# Patient Record
Sex: Male | Born: 1975 | Race: White | Hispanic: No | Marital: Married | Smoking: Never smoker
Health system: Southern US, Community
[De-identification: ages and names within clinical notes are randomized; demographics above are authoritative.]

## PROBLEM LIST (undated history)

## (undated) HISTORY — PX: SHOULDER ARTHROSCOPY: SHX128

## (undated) HISTORY — PX: KNEE SURGERY: SHX244

---

## 2002-04-16 ENCOUNTER — Encounter: Admission: RE | Admit: 2002-04-16 | Discharge: 2002-04-16 | Payer: Self-pay | Admitting: Family Medicine

## 2003-01-31 ENCOUNTER — Emergency Department (HOSPITAL_COMMUNITY): Admission: EM | Admit: 2003-01-31 | Discharge: 2003-02-01 | Payer: Self-pay | Admitting: Emergency Medicine

## 2003-02-01 ENCOUNTER — Inpatient Hospital Stay (HOSPITAL_COMMUNITY): Admission: EM | Admit: 2003-02-01 | Discharge: 2003-02-03 | Payer: Self-pay

## 2003-02-01 ENCOUNTER — Encounter: Payer: Self-pay | Admitting: Emergency Medicine

## 2010-09-16 ENCOUNTER — Ambulatory Visit (HOSPITAL_BASED_OUTPATIENT_CLINIC_OR_DEPARTMENT_OTHER)
Admission: RE | Admit: 2010-09-16 | Discharge: 2010-09-16 | Disposition: A | Discharge status: Home / Self Care | Payer: BC Managed Care – PPO | Source: Ambulatory Visit | Attending: Orthopedic Surgery | Admitting: Orthopedic Surgery

## 2010-09-16 ENCOUNTER — Ambulatory Visit (HOSPITAL_COMMUNITY): Payer: BC Managed Care – PPO | Attending: Orthopedic Surgery

## 2010-09-16 DIAGNOSIS — Z01812 Encounter for preprocedural laboratory examination: Secondary | ICD-10-CM | POA: Insufficient documentation

## 2010-09-16 DIAGNOSIS — S42009A Fracture of unspecified part of unspecified clavicle, initial encounter for closed fracture: Secondary | ICD-10-CM | POA: Insufficient documentation

## 2010-09-16 DIAGNOSIS — S4380XA Sprain of other specified parts of unspecified shoulder girdle, initial encounter: Secondary | ICD-10-CM | POA: Insufficient documentation

## 2010-09-16 DIAGNOSIS — S42033A Displaced fracture of lateral end of unspecified clavicle, initial encounter for closed fracture: Secondary | ICD-10-CM | POA: Insufficient documentation

## 2010-09-16 DIAGNOSIS — Y998 Other external cause status: Secondary | ICD-10-CM | POA: Insufficient documentation

## 2010-09-16 LAB — POCT HEMOGLOBIN-HEMACUE: Hemoglobin: 16.3 g/dL (ref 13.0–17.0)
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
## 2011-12-07 IMAGING — RF DG CLAVICLE*R*
1 series · 2 of 2 positions shown · non-contrast
Comparison: None.

CLINICAL DATA: Right clavicle fixation

RIGHT CLAVICLE - 2+ VIEWS

[Series 1: run · 2 of 2 slices shown]
[im 1/2]
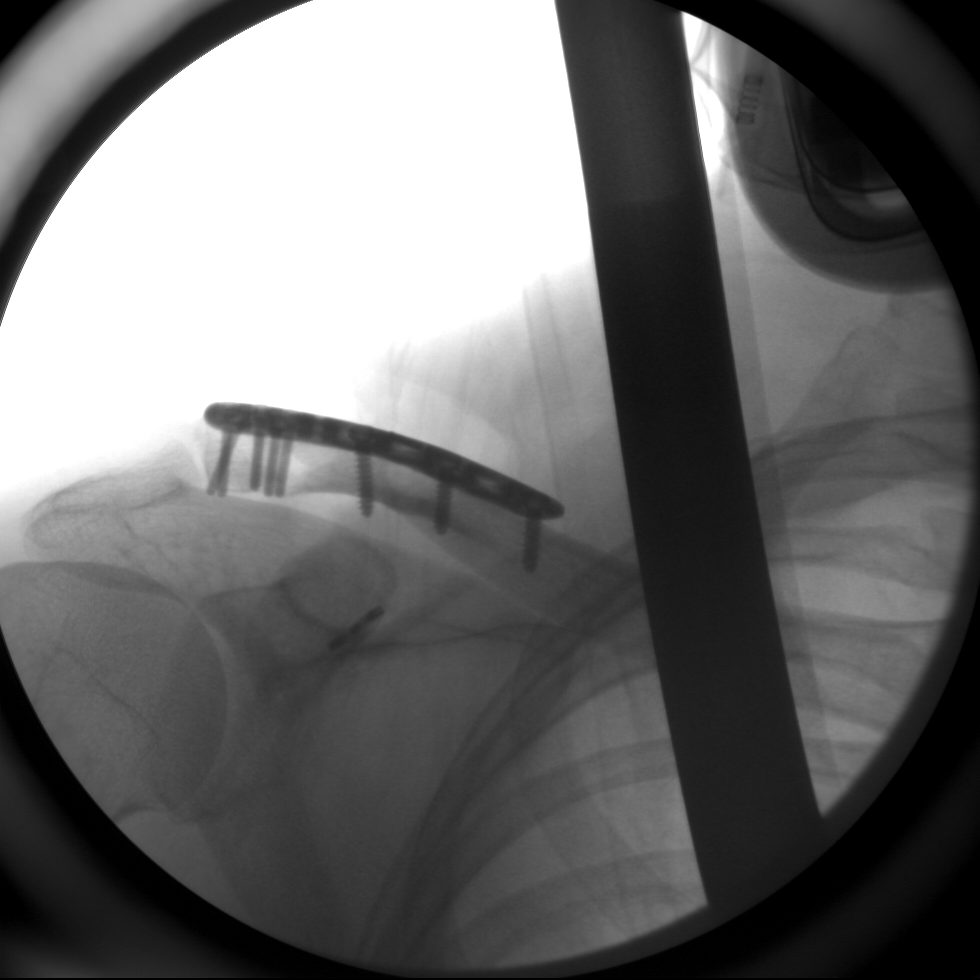
[im 2/2]
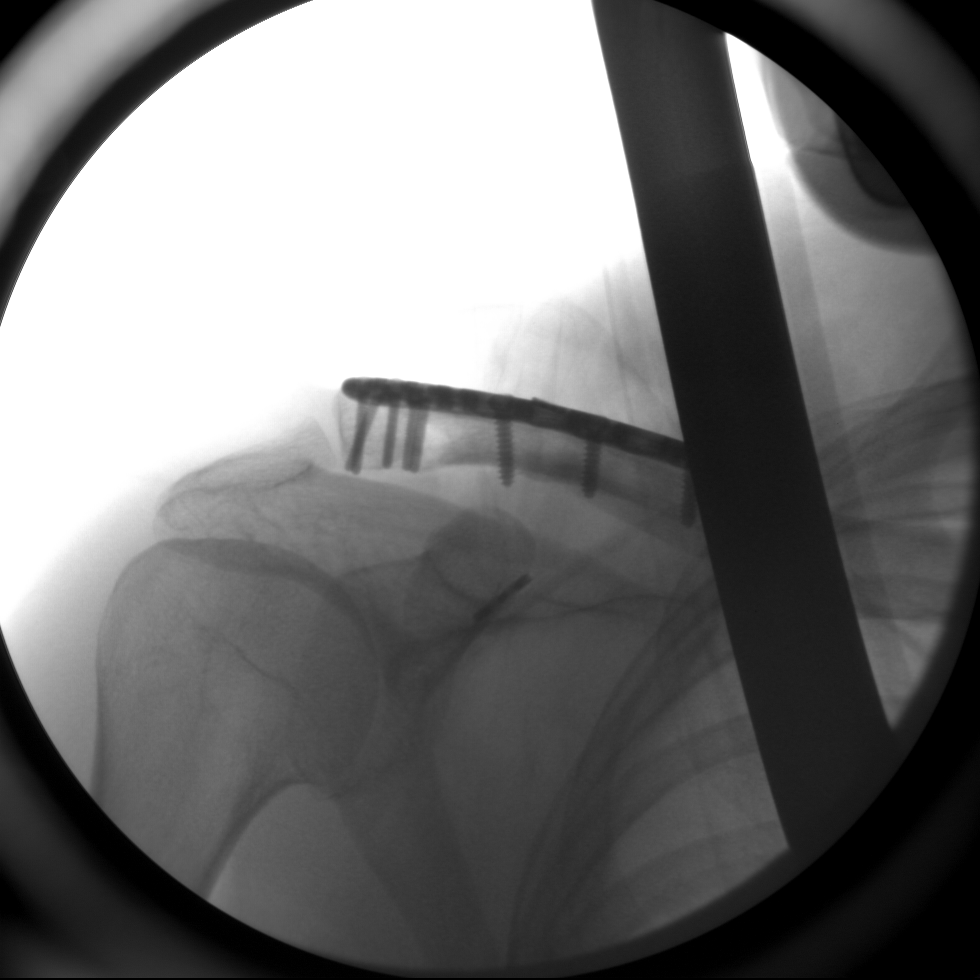

[2 of 2 positions shown; findings below may reference images not displayed]

FINDINGS: Dynamic fixation plate spans a distal right clavicle
fracture.  There are eight cortical screws.  Normal alignment
IMPRESSION: No complication following open reduction internal fixation of right
clavicle fracture.

## 2013-06-11 ENCOUNTER — Emergency Department (HOSPITAL_COMMUNITY)
Admission: EM | Admit: 2013-06-11 | Discharge: 2013-06-11 | Disposition: A | Discharge status: Home / Self Care | Payer: 59 | Attending: Emergency Medicine | Admitting: Emergency Medicine

## 2013-06-11 ENCOUNTER — Encounter (HOSPITAL_COMMUNITY): Payer: Self-pay | Admitting: Emergency Medicine

## 2013-06-11 DIAGNOSIS — I498 Other specified cardiac arrhythmias: Secondary | ICD-10-CM | POA: Insufficient documentation

## 2013-06-11 DIAGNOSIS — R55 Syncope and collapse: Secondary | ICD-10-CM

## 2013-06-11 DIAGNOSIS — R110 Nausea: Secondary | ICD-10-CM | POA: Insufficient documentation

## 2013-06-11 DIAGNOSIS — R42 Dizziness and giddiness: Secondary | ICD-10-CM | POA: Insufficient documentation

## 2013-06-11 LAB — CBC WITH DIFFERENTIAL/PLATELET
BASOS PCT: 0 % (ref 0–1)
Basophils Absolute: 0 10*3/uL (ref 0.0–0.1)
EOS ABS: 0.1 10*3/uL (ref 0.0–0.7)
EOS PCT: 1 % (ref 0–5)
HCT: 41 % (ref 39.0–52.0)
Hemoglobin: 14.7 g/dL (ref 13.0–17.0)
LYMPHS ABS: 1.2 10*3/uL (ref 0.7–4.0)
Lymphocytes Relative: 13 % (ref 12–46)
MCH: 31 pg (ref 26.0–34.0)
MCHC: 35.9 g/dL (ref 30.0–36.0)
MCV: 86.5 fL (ref 78.0–100.0)
MONOS PCT: 6 % (ref 3–12)
Monocytes Absolute: 0.5 10*3/uL (ref 0.1–1.0)
NEUTROS PCT: 80 % — AB (ref 43–77)
Neutro Abs: 7.4 10*3/uL (ref 1.7–7.7)
PLATELETS: 163 10*3/uL (ref 150–400)
RBC: 4.74 MIL/uL (ref 4.22–5.81)
RDW: 11.9 % (ref 11.5–15.5)
WBC: 9.3 10*3/uL (ref 4.0–10.5)

## 2013-06-11 LAB — POCT I-STAT, CHEM 8
BUN: 19 mg/dL (ref 6–23)
CALCIUM ION: 1.23 mmol/L (ref 1.12–1.23)
CREATININE: 1 mg/dL (ref 0.50–1.35)
Chloride: 104 mEq/L (ref 96–112)
Glucose, Bld: 88 mg/dL (ref 70–99)
HCT: 43 % (ref 39.0–52.0)
HEMOGLOBIN: 14.6 g/dL (ref 13.0–17.0)
Potassium: 4.4 mEq/L (ref 3.7–5.3)
SODIUM: 142 meq/L (ref 137–147)
TCO2: 26 mmol/L (ref 0–100)

## 2013-06-11 LAB — POCT I-STAT TROPONIN I: Troponin i, poc: 0.01 ng/mL (ref 0.00–0.08)

## 2013-06-11 LAB — GLUCOSE, CAPILLARY: GLUCOSE-CAPILLARY: 74 mg/dL (ref 70–99)

## 2013-06-11 MED ORDER — SODIUM CHLORIDE 0.9 % IV SOLN
1000.0000 mL | INTRAVENOUS | Status: DC
Start: 2013-06-11 — End: 2013-06-11
  Administered 2013-06-11: 1000 mL via INTRAVENOUS

## 2013-06-11 MED ORDER — SODIUM CHLORIDE 0.9 % IV SOLN
1000.0000 mL | Freq: Once | INTRAVENOUS | Status: AC
  Administered 2013-06-11: 1000 mL via INTRAVENOUS

## 2013-06-11 NOTE — ED Notes (Addendum)
Patient arrived via EMS from work where the patient had a syncopal episode while sitting in a chair for apprx 5 mins. Patient states he didn't feel right and was nauseated prior to episode. Patient slumped over in chair unresponsive with eyes open. Denies any chest pain, shortness of breath. HR 30-40's, BP sys 80's. Patient just had a physical yesterday at his MD's office. He also received a tetanus shot there.

## 2013-06-11 NOTE — Discharge Instructions (Signed)
Syncope  Syncope is a fainting spell. This means the person loses consciousness and drops to the ground. The person is generally unconscious for less than 5 minutes. The person may have some muscle twitches for up to 15 seconds before waking up and returning to normal. Syncope occurs more often in elderly people, but it can happen to anyone. While most causes of syncope are not dangerous, syncope can be a sign of a serious medical problem. It is important to seek medical care.   CAUSES   Syncope is caused by a sudden decrease in blood flow to the brain. The specific cause is often not determined. Factors that can trigger syncope include:   Taking medicines that lower blood pressure.   Sudden changes in posture, such as standing up suddenly.   Taking more medicine than prescribed.   Standing in one place for too long.   Seizure disorders.   Dehydration and excessive exposure to heat.   Low blood sugar (hypoglycemia).   Straining to have a bowel movement.   Heart disease, irregular heartbeat, or other circulatory problems.   Fear, emotional distress, seeing blood, or severe pain.  SYMPTOMS   Right before fainting, you may:   Feel dizzy or lightheaded.   Feel nauseous.   See all white or all black in your field of vision.   Have cold, clammy skin.  DIAGNOSIS   Your caregiver will ask about your symptoms, perform a physical exam, and perform electrocardiography (ECG) to record the electrical activity of your heart. Your caregiver may also perform other heart or blood tests to determine the cause of your syncope.  TREATMENT   In most cases, no treatment is needed. Depending on the cause of your syncope, your caregiver may recommend changing or stopping some of your medicines.  HOME CARE INSTRUCTIONS   Have someone stay with you until you feel stable.   Do not drive, operate machinery, or play sports until your caregiver says it is okay.   Keep all follow-up appointments as directed by your  caregiver.   Lie down right away if you start feeling like you might faint. Breathe deeply and steadily. Wait until all the symptoms have passed.   Drink enough fluids to keep your urine clear or pale yellow.   If you are taking blood pressure or heart medicine, get up slowly, taking several minutes to sit and then stand. This can reduce dizziness.  SEEK IMMEDIATE MEDICAL CARE IF:    You have a severe headache.   You have unusual pain in the chest, abdomen, or back.   You are bleeding from the mouth or rectum, or you have black or tarry stool.   You have an irregular or very fast heartbeat.   You have pain with breathing.   You have repeated fainting or seizure-like jerking during an episode.   You faint when sitting or lying down.   You have confusion.   You have difficulty walking.   You have severe weakness.   You have vision problems.  If you fainted, call your local emergency services (911 in [place] Do not drive yourself to the hospital.   MAKE SURE YOU:   Understand these instructions.   Will watch your condition.   Will get help right away if you are not doing well or get worse.  Document Released: 04/10/2005 Document Revised: 10/10/2011 Document Reviewed: 06/09/2011  Exit[employer] Patient Information 2014 [employer].

## 2013-06-11 NOTE — ED Notes (Signed)
Patient given Zofran 4mg  IV by EMS prior to arrival.

## 2013-06-11 NOTE — ED Provider Notes (Signed)
CSN: [id number]     Arrival date & time 06/11/13  1218 History  First MD Initiated Contact with Patient 06/11/13 1242     Chief Complaint  Patient presents with  . Loss of Consciousness    HPI Comments: Patient states he was at work sitting in a chair. He suddenly started to feel nauseated and lightheaded. He was not having any trouble with any chest pain or shortness of breath. The patient slumped over in the chair. Coworkers called 911. His eyes were open but he was not responsive. He recently had a physical done at his doctor's office just the other day. Had a tetanus shot otherwise no other treatments. Patient states he does exercise regularly and is a runner. Normally his heart rate is slow.  Patient is a 38 y.o. male presenting with syncope. The history is provided by the patient.  Loss of Consciousness Episode history:  Single Most recent episode:  Today Chronicity:  New Context: inactivity   Context: not blood draw, not bowel movement, not dehydration and not medication change     History reviewed. No pertinent past medical history. Past Surgical History  Procedure Laterality Date  . Shoulder arthroscopy    . Knee surgery     History reviewed. No pertinent family history. History  Substance Use Topics  . Smoking status: Never Smoker   . Smokeless tobacco: Not on file  . Alcohol Use: Not on file    Review of Systems  Cardiovascular: Positive for syncope.  All other systems reviewed and are negative.      Allergies  Shellfish allergy  Home Medications  No current outpatient prescriptions on file. BP 108/64  Pulse 51  Temp(Src) 97.7 F (36.5 C)  Resp 29  SpO2 100% Physical Exam  Nursing note and vitals reviewed. Constitutional: He appears well-developed and well-nourished. No distress.  HENT:  Head: Normocephalic and atraumatic.  Right Ear: External ear normal.  Left Ear: External ear normal.  Eyes: Conjunctivae are normal. Right eye exhibits no  discharge. Left eye exhibits no discharge. No scleral icterus.  Neck: Neck supple. No tracheal deviation present.  Cardiovascular: Regular rhythm and intact distal pulses.  Bradycardia present.   Pulmonary/Chest: Effort normal and breath sounds normal. No stridor. No respiratory distress. He has no wheezes. He has no rales.  Abdominal: Soft. Bowel sounds are normal. He exhibits no distension. There is no tenderness. There is no rebound and no guarding.  Musculoskeletal: He exhibits no edema and no tenderness.  Neurological: He is alert. He has normal strength. No cranial nerve deficit (no facial droop, extraocular movements intact, no slurred speech) or sensory deficit. He exhibits normal muscle tone. He displays no seizure activity. Coordination normal.  Skin: Skin is warm and dry. No rash noted.  Psychiatric: He has a normal mood and affect.    ED Course  Procedures (including critical care time) Labs Review Labs Reviewed  CBC WITH DIFFERENTIAL - Abnormal; Notable for the following:    Neutrophils Relative % 80 (*)    All other components within normal limits  GLUCOSE, CAPILLARY  POCT I-STAT, CHEM 8  POCT I-STAT TROPONIN I  POCT CBG (FASTING - GLUCOSE)-MANUAL ENTRY   Imaging Review No results found.  EKG Interpretation    Date/Time:  Wednesday June 11 2013 12:31:10 EST Ventricular Rate:  40 PR Interval:  156 QRS Duration: 90 QT Interval:  540 QTC Calculation: 440 R Axis:   101 Text Interpretation:  Sinus bradycardia Right axis deviation Abnrm T, consider  ischemia, anterolateral lds Borderline ST elevation, anterior leads No old tracing to compare Confirmed by Ahmari Garton  MD-J, Arland Usery ([id number]) on 06/11/2013 3:01:43 PM          1618  Stable in the ED.  Did have an episode of feeling faint while he was having blood drawn.  MDM   Final diagnoses:  Syncope    Pt's EKG is abnormal but I doubt related to cardiac ischemia.  He exercises and runs regularly.  Possible vasovagal  episode. Stable for outpatient follow up but recommended further testing such as holter monitor, echocardiogram.    Celene Kras, MD 06/11/13 [phone]

## 2018-01-21 DIAGNOSIS — Z23 Encounter for immunization: Secondary | ICD-10-CM | POA: Diagnosis not present

## 2018-07-02 DIAGNOSIS — H6121 Impacted cerumen, right ear: Secondary | ICD-10-CM | POA: Diagnosis not present

## 2018-07-02 DIAGNOSIS — E7800 Pure hypercholesterolemia, unspecified: Secondary | ICD-10-CM | POA: Diagnosis not present

## 2018-07-02 DIAGNOSIS — R109 Unspecified abdominal pain: Secondary | ICD-10-CM | POA: Diagnosis not present

## 2018-07-02 DIAGNOSIS — M79676 Pain in unspecified toe(s): Secondary | ICD-10-CM | POA: Diagnosis not present

## 2018-07-09 ENCOUNTER — Other Ambulatory Visit: Payer: Self-pay

## 2018-07-09 ENCOUNTER — Ambulatory Visit (INDEPENDENT_AMBULATORY_CARE_PROVIDER_SITE_OTHER): Payer: BLUE CROSS/BLUE SHIELD | Admitting: Podiatry

## 2018-07-09 VITALS — BP 116/71 | HR 48

## 2018-07-09 DIAGNOSIS — Z79899 Other long term (current) drug therapy: Secondary | ICD-10-CM | POA: Diagnosis not present

## 2018-07-09 DIAGNOSIS — B351 Tinea unguium: Secondary | ICD-10-CM | POA: Diagnosis not present

## 2018-07-09 DIAGNOSIS — B353 Tinea pedis: Secondary | ICD-10-CM

## 2018-07-09 NOTE — Patient Instructions (Signed)

## 2018-07-10 LAB — CBC WITH DIFFERENTIAL/PLATELET
Absolute Monocytes: 353 cells/uL (ref 200–950)
BASOS ABS: 49 {cells}/uL (ref 0–200)
Basophils Relative: 1.3 %
EOS ABS: 190 {cells}/uL (ref 15–500)
Eosinophils Relative: 5 %
HEMATOCRIT: 42.9 % (ref 38.5–50.0)
Hemoglobin: 15.2 g/dL (ref 13.2–17.1)
LYMPHS ABS: 1733 {cells}/uL (ref 850–3900)
MCH: 31.2 pg (ref 27.0–33.0)
MCHC: 35.4 g/dL (ref 32.0–36.0)
MCV: 88.1 fL (ref 80.0–100.0)
MONOS PCT: 9.3 %
MPV: 12.6 fL — ABNORMAL HIGH (ref 7.5–12.5)
NEUTROS ABS: 1474 {cells}/uL — AB (ref 1500–7800)
Neutrophils Relative %: 38.8 %
Platelets: 151 10*3/uL (ref 140–400)
RBC: 4.87 10*6/uL (ref 4.20–5.80)
RDW: 12.2 % (ref 11.0–15.0)
Total Lymphocyte: 45.6 %
WBC: 3.8 10*3/uL (ref 3.8–10.8)

## 2018-07-10 LAB — HEPATIC FUNCTION PANEL
AG Ratio: 2.3 (calc) (ref 1.0–2.5)
ALT: 54 U/L — ABNORMAL HIGH (ref 9–46)
AST: 36 U/L (ref 10–40)
Albumin: 4.5 g/dL (ref 3.6–5.1)
Alkaline phosphatase (APISO): 59 U/L (ref 36–130)
BILIRUBIN DIRECT: 0.1 mg/dL (ref 0.0–0.2)
Globulin: 2 g/dL (calc) (ref 1.9–3.7)
Indirect Bilirubin: 0.4 mg/dL (calc) (ref 0.2–1.2)
Total Bilirubin: 0.5 mg/dL (ref 0.2–1.2)
Total Protein: 6.5 g/dL (ref 6.1–8.1)

## 2018-07-12 ENCOUNTER — Telehealth: Payer: Self-pay | Admitting: *Deleted

## 2018-07-12 NOTE — Telephone Encounter (Signed)
Patient called back about message left for lab results.

## 2018-07-12 NOTE — Telephone Encounter (Signed)
-----   Message from Vivi Barrack, DPM sent at 07/11/2018 10:01 AM EDT ----- Val- please let him know that his liver enzyme is mildly increase and the neutrophils are mildly decreased. Although very mild for both I would not recommend doing oral Lamisil as lamisil can worsen both of these issues. I would do topical antifungal. We could do a ketoconazole cream for athletes foot then a topical, such as Penlac or Jublia for the nails. He was interested in having the 5th toenails removed. Chip Boer was going to get the cost and let him know. Thanks.

## 2018-07-15 NOTE — Progress Notes (Signed)
Subjective:   Patient ID: Tony Merritt, male   DOB: 43 y.o.   MRN: [id number]   HPI 43 year old male presents the office today for concerns of toenail thickened discoloration.  States the fifth toe is almost thickened discolored and cause pain with pressure in shoes.  He states they are not growing as well.  Also he has noticed the left second toenail is growing in angle.  All his nails are thick and yellow.  This is been ongoing for at least 1 year.  No recent treatment.  No other concerns.   Review of Systems  All other systems reviewed and are negative.  No past medical history on file.  Past Surgical History:  Procedure Laterality Date  . KNEE SURGERY    . SHOULDER ARTHROSCOPY      No current outpatient medications on file.  Allergies  Allergen Reactions  . Shellfish Allergy Anaphylaxis         Objective:  Physical Exam  General: AAO x3, NAD  Dermatological: In general his nails have yellow discoloration are hypertrophic and dystrophic.  He has noted bilateral fifth digit toenails are dystrophic as well.  There is tinea pedis present interdigitally.  There is no open sores there is no skin fissures or drainage.   Vascular: Dorsalis Pedis artery and Posterior Tibial artery pedal pulses are 2/4 bilateral with immedate capillary fill time. There is no pain with calf compression, swelling, warmth, erythema.   Neruologic: Grossly intact via light touch bilateral. Protective threshold with Semmes Wienstein monofilament intact to all pedal sites bilateral.   Musculoskeletal: No gross boney pedal deformities bilateral. No pain, crepitus, or limitation noted with foot and ankle range of motion bilateral. Muscular strength 5/5 in all groups tested bilateral.  Gait: Unassisted, Nonantalgic.     Assessment:   Onychomcyosis; onychodystrophy; tinea pedis     Plan:  -Treatment options discussed including all alternatives, risks, and complications -Etiology of symptoms  were discussed -We discussed multiple treatment options for his nails.  There is a component of fungus but this also damage to the nails.  We discussed the fifth toenail removal but he was to check the cost him prior to doing this.  In general discussed treatment options for nail fungus as well as athlete's foot.  We will check a CBC and LFT.  If this comes back normal will start Lamisil.  Discussed side effects, success rates.  Vivi Barrack DPM

## 2018-07-29 ENCOUNTER — Telehealth: Payer: Self-pay | Admitting: Podiatry

## 2018-07-29 NOTE — Telephone Encounter (Signed)
I reviewed lab review communications and I had left a message for pt to call for results and he had called and the message had been taken but not sent to me. I reviewed Dr. Gabriel Rung review of results 07/11/2018 10:01am and told pt I would ask Dr. Ardelle Anton which topical he would orders and call again and cancel the 08/13/2018 appt.

## 2018-07-29 NOTE — Telephone Encounter (Signed)
Pt is scheduled for a Lamisil appt. And said he never heard anything about starting the medication after Lab work. Please call pt ASAP and then let me know if I need to cancel appt. Thanks

## 2018-07-30 MED ORDER — EFINACONAZOLE 10 % EX SOLN: CUTANEOUS | 11 refills | Status: DC

## 2018-07-30 NOTE — Telephone Encounter (Signed)
Left message informing pt Dr. Ardelle Anton would like pt to begin Jublia, and it would be ordered from [facility] [phone] and they would call with coverage and delivery information, and Chip Boer - Accounts Receivable stated the toenail removal is $575.00/toenail and to call Appointment line [phone].

## 2018-07-30 NOTE — Addendum Note (Signed)
Addended by: Alphia Kava D on: 07/30/2018 11:19 AM   Modules accepted: Orders

## 2018-07-30 NOTE — Telephone Encounter (Signed)
From the previous message-  Val- please let him know that his liver enzyme is mildly increase and the neutrophils are mildly decreased. Although very mild for both I would not recommend doing oral Lamisil as lamisil can worsen both of these issues. I would do topical antifungal. We could do a ketoconazole cream for athletes foot then a topical, such as Penlac or Jublia for the nails. He was interested in having the 5th toenails removed. Chip Boer was going to get the cost and let him know. Thanks.  I would prefer the jublia but if not covered then would do penlac.   Thank you.

## 2018-08-13 ENCOUNTER — Ambulatory Visit: Payer: Self-pay | Admitting: Podiatry

## 2019-02-04 DIAGNOSIS — L237 Allergic contact dermatitis due to plants, except food: Secondary | ICD-10-CM | POA: Diagnosis not present

## 2020-08-11 ENCOUNTER — Encounter (HOSPITAL_BASED_OUTPATIENT_CLINIC_OR_DEPARTMENT_OTHER): Payer: Self-pay | Admitting: Family Medicine

## 2020-08-11 ENCOUNTER — Other Ambulatory Visit: Payer: Self-pay

## 2020-08-11 ENCOUNTER — Ambulatory Visit (HOSPITAL_BASED_OUTPATIENT_CLINIC_OR_DEPARTMENT_OTHER): Payer: 59 | Admitting: Family Medicine

## 2020-08-11 ENCOUNTER — Other Ambulatory Visit (HOSPITAL_BASED_OUTPATIENT_CLINIC_OR_DEPARTMENT_OTHER)
Admission: RE | Admit: 2020-08-11 | Discharge: 2020-08-11 | Disposition: A | Discharge status: Home / Self Care | Payer: 59 | Source: Ambulatory Visit | Attending: Family Medicine | Admitting: Family Medicine

## 2020-08-11 VITALS — BP 118/70 | HR 54 | Ht 68.0 in | Wt 145.2 lb

## 2020-08-11 DIAGNOSIS — M791 Myalgia, unspecified site: Secondary | ICD-10-CM | POA: Insufficient documentation

## 2020-08-11 DIAGNOSIS — R5383 Other fatigue: Secondary | ICD-10-CM | POA: Diagnosis present

## 2020-08-11 DIAGNOSIS — Z7689 Persons encountering health services in other specified circumstances: Secondary | ICD-10-CM

## 2020-08-11 LAB — FERRITIN: Ferritin: 165 ng/mL (ref 24–336)

## 2020-08-11 LAB — CBC WITH DIFFERENTIAL/PLATELET
Abs Immature Granulocytes: 0.01 10*3/uL (ref 0.00–0.07)
Basophils Absolute: 0.1 10*3/uL (ref 0.0–0.1)
Basophils Relative: 1 %
Eosinophils Absolute: 0.1 10*3/uL (ref 0.0–0.5)
Eosinophils Relative: 3 %
HCT: 44.7 % (ref 39.0–52.0)
Hemoglobin: 15.7 g/dL (ref 13.0–17.0)
Immature Granulocytes: 0 %
Lymphocytes Relative: 34 %
Lymphs Abs: 1.5 10*3/uL (ref 0.7–4.0)
MCH: 30.8 pg (ref 26.0–34.0)
MCHC: 35.1 g/dL (ref 30.0–36.0)
MCV: 87.8 fL (ref 80.0–100.0)
Monocytes Absolute: 0.5 10*3/uL (ref 0.1–1.0)
Monocytes Relative: 12 %
Neutro Abs: 2.1 10*3/uL (ref 1.7–7.7)
Neutrophils Relative %: 50 %
Platelets: 169 10*3/uL (ref 150–400)
RBC: 5.09 MIL/uL (ref 4.22–5.81)
RDW: 11.6 % (ref 11.5–15.5)
WBC: 4.2 10*3/uL (ref 4.0–10.5)
nRBC: 0 % (ref 0.0–0.2)

## 2020-08-11 LAB — LIPID PANEL
Cholesterol: 266 mg/dL — ABNORMAL HIGH (ref 0–200)
HDL: 72 mg/dL (ref >40)
LDL Cholesterol: 168 mg/dL — ABNORMAL HIGH (ref 0–99)
Total CHOL/HDL Ratio: 3.7 RATIO
Triglycerides: 131 mg/dL (ref <150)
VLDL: 26 mg/dL (ref 0–40)

## 2020-08-11 LAB — SEDIMENTATION RATE: Sed Rate: 3 mm/hr (ref 0–16)

## 2020-08-11 LAB — COMPREHENSIVE METABOLIC PANEL
ALT: 22 U/L (ref 0–44)
AST: 24 U/L (ref 15–41)
Albumin: 4.8 g/dL (ref 3.5–5.0)
Alkaline Phosphatase: 56 U/L (ref 38–126)
Anion gap: 7 (ref 5–15)
BUN: 20 mg/dL (ref 6–20)
CO2: 29 mmol/L (ref 22–32)
Calcium: 10.3 mg/dL (ref 8.9–10.3)
Chloride: 102 mmol/L (ref 98–111)
Creatinine, Ser: 1.08 mg/dL (ref 0.61–1.24)
GFR, Estimated: >60 mL/min (ref >60)
Glucose, Bld: 87 mg/dL (ref 70–99)
Potassium: 3.9 mmol/L (ref 3.5–5.1)
Sodium: 138 mmol/L (ref 135–145)
Total Bilirubin: 0.5 mg/dL (ref 0.3–1.2)
Total Protein: 7.2 g/dL (ref 6.5–8.1)

## 2020-08-11 NOTE — Progress Notes (Signed)
New Patient Office Visit  Subjective:  Patient ID: Tony Merritt, male    DOB: 07-17-1975  Age: 45 y.o. MRN: [id number]  CC:  Chief Complaint  Patient presents with  . Establish Care  . Fatigue    Pt complains of issues of "slow recovery" after physical activity or working out. Pt states "I feel like I just give out easier.  If I go for a run, I have no energy to go out and do it again"    HPI TYAN DY is a 45 year old male presenting to establish in clinic.  He has current concerns today regarding exercise intolerance/fatigue.  Denies significant past medical history.  Does not take any regularly prescribed medications, does take an over-the-counter vitamin.  Exercise intolerance: Reports that he is typically an avid cyclist and running with typical weekly volumes of 100 mile cycling and 5 to 15 miles running.  Indicates that over the past several months he has felt he has been slow to recover after bouts of exertion.  Reports increased muscle soreness and fatigue for several days after exercise.  Reports that he "always feels like I am dragging".  Has had some periods of time where he has scaled back his activity due to injuries or illness over the same timeframe.  When he has gone for rides or runs during this timeframe, they will feel difficult for the initial portion of the exercise and then he will begin to feel good during the remainder of the exercise.  Symptoms will been begin an hour or so after completion of exercise.  Denies any associated headaches, vision changes, chest pain.  History reviewed. No pertinent past medical history.  Past Surgical History:  Procedure Laterality Date  . KNEE SURGERY    . SHOULDER ARTHROSCOPY      Family History  Problem Relation Age of Onset  . Healthy Mother   . Healthy Father   . Cancer Maternal Uncle        deceased at 55 - unknown type of cancer  . Cancer Maternal Grandmother        deceased at age 68 - cancer type  unknown  . Heart attack Maternal Grandfather        deceased at age 33  . Heart attack Paternal Grandmother        deceased at age 71  . Heart attack Paternal Grandfather        deceased at age 20    Social History   Socioeconomic History  . Marital status: Single    Spouse name: Not on file  . Number of children: Not on file  . Years of education: Not on file  . Highest education level: Not on file  Occupational History  . Not on file  Tobacco Use  . Smoking status: Never Smoker  . Smokeless tobacco: Never Used  Vaping Use  . Vaping Use: Never used  Substance and Sexual Activity  . Alcohol use: Not Currently  . Drug use: Not Currently  . Sexual activity: Yes    Birth control/protection: None  Other Topics Concern  . Not on file  Social History Narrative  . Not on file   Social Determinants of Health   Financial Resource Strain: Not on file  Food Insecurity: Not on file  Transportation Needs: Not on file  Physical Activity: Not on file  Stress: Not on file  Social Connections: Not on file  Intimate Partner Violence: Not on file    Objective:  Today's Vitals: BP 118/70   Pulse (!) 54   Ht $R'5\' 8"'Ho$  (1.727 m)   Wt 145 lb 3.2 oz (65.9 kg)   SpO2 98%   BMI 22.08 kg/m   Physical Exam  Pleasant 45 year old male in no acute distress Cardiovascular exam with regular rate and rhythm, no murmurs appreciated Lungs clear to auscultation bilaterally  Assessment & Plan:   Problem List Items Addressed This Visit      Other   Fatigue - Primary    Uncertain etiology, differential includes overtraining syndrome, endocrine etiology such as hypothyroidism, autoimmune condition, hepatitis, anemia; less likely related to infectious etiology such as Lyme disease Will check initial labs as below Advised on relative reduction of physical activity, reducing current activity by half or more, incorporation of crosstraining activities outside of running and cycling Plan for  follow-up in about 3 to 4 weeks or sooner as needed Will review symptoms and labs at that time If laboratory evaluation unremarkable and symptoms persist, consider evaluation for less likely causes such as Lyme disease      Relevant Orders   CBC with Differential/Platelet   Comprehensive metabolic panel   Lipid panel   Thyroid Panel With TSH   Ferritin   Sed Rate (ESR)   Myalgia    Further evaluation and management as per above      Relevant Orders   CBC with Differential/Platelet   Comprehensive metabolic panel   Lipid panel   Thyroid Panel With TSH   Ferritin   Sed Rate (ESR)    Other Visit Diagnoses    Encounter to establish care with new doctor       Relevant Orders   CBC with Differential/Platelet   Comprehensive metabolic panel   Lipid panel      Outpatient Encounter Medications as of 08/11/2020  Medication Sig  . [DISCONTINUED] Efinaconazole (JUBLIA) 10 % SOLN Apply polish to affected toenails daily and remove weekly with alcohol, then repeat. (Patient not taking: Reported on 08/11/2020)   No facility-administered encounter medications on file as of 08/11/2020.   Spent 45 minutes on this patient encounter, including preparation, chart review, face-to-face counseling with patient and coordination of care, and documentation of encounter  Follow-up: Return in about 1 month (around 09/10/2020) for Follow Up.   Domanick Cuccia J De [place], MD

## 2020-08-11 NOTE — Patient Instructions (Signed)
  Medication Instructions:  Your physician recommends that you continue on your current medications as directed. Please refer to the Current Medication list given to you today. --If you need a refill on any your medications before your next appointment, please call your pharmacy first. If no refills are authorized on file call the office.--  Lab Work: Your physician has recommended that you have lab work today: CBC, CMET, Lipid Profile, and Thyroid Panel If you have labs (blood work) drawn today and your tests are completely normal, you will receive your results only by: Marland Kitchen MyChart Message (if you have MyChart) OR . A phone call from our staff. Please ensure you check your voicemail in the event that you authorized detailed messages to be left on a delegated number. If you have any lab test that is abnormal or we need to change your treatment, we will call you to review the results.  Follow-Up: Your next appointment:   Your physician recommends that you schedule a follow-up appointment in: 4 WEEKS with Dr. de Peru  Thanks for letting us be apart of your health journey!!  Primary Care and Sports Medicine   Dr. de Peru and Shawna Clamp, DNP, AGNP  We recommend signing up for the patient portal called "MyChart".  Sign up information is provided on this After Visit Summary.  MyChart is used to connect with patients for Virtual Visits (Telemedicine).  Patients are able to view lab/test results, encounter notes, upcoming appointments, etc.  Non-urgent messages can be sent to your provider as well.   To learn more about what you can do with MyChart, please visit --  [email or web address].

## 2020-08-11 NOTE — Assessment & Plan Note (Signed)
Further evaluation and management as per above

## 2020-08-11 NOTE — Assessment & Plan Note (Signed)
Uncertain etiology, differential includes overtraining syndrome, endocrine etiology such as hypothyroidism, autoimmune condition, hepatitis, anemia; less likely related to infectious etiology such as Lyme disease Will check initial labs as below Advised on relative reduction of physical activity, reducing current activity by half or more, incorporation of crosstraining activities outside of running and cycling Plan for follow-up in about 3 to 4 weeks or sooner as needed Will review symptoms and labs at that time If laboratory evaluation unremarkable and symptoms persist, consider evaluation for less likely causes such as Lyme disease

## 2020-08-12 LAB — THYROID PANEL WITH TSH: TSH: 2.9 u[IU]/mL (ref 0.450–4.500)

## 2020-08-13 LAB — THYROID PANEL WITH TSH
Free Thyroxine Index: 1.7 (ref 1.2–4.9)
T3 Uptake Ratio: 28 % (ref 24–39)
T4, Total: 6.1 ug/dL (ref 4.5–12.0)

## 2020-08-26 ENCOUNTER — Telehealth (HOSPITAL_BASED_OUTPATIENT_CLINIC_OR_DEPARTMENT_OTHER): Payer: Self-pay

## 2020-08-26 NOTE — Telephone Encounter (Signed)
-----  Message from Raymond J de [place], MD sent at 08/24/2020  4:40 PM EDT ----- Normal ferritin and ESR.  These are typically elevated in settings of acute and chronic inflammation, thus normal values decreases the likelihood of active ongoing inflammation.

## 2020-08-26 NOTE — Telephone Encounter (Signed)
Called and spoke to patient. Patient is aware and agreeable to lab results and recommendations. Informed patient that lab results and comments were available for him to view in MyChart if he has any questions or concerns.

## 2020-09-09 ENCOUNTER — Other Ambulatory Visit (HOSPITAL_BASED_OUTPATIENT_CLINIC_OR_DEPARTMENT_OTHER)
Admission: RE | Admit: 2020-09-09 | Discharge: 2020-09-09 | Disposition: A | Discharge status: Home / Self Care | Payer: 59 | Source: Ambulatory Visit | Attending: Family Medicine | Admitting: Family Medicine

## 2020-09-09 ENCOUNTER — Other Ambulatory Visit: Payer: Self-pay

## 2020-09-09 ENCOUNTER — Ambulatory Visit (HOSPITAL_BASED_OUTPATIENT_CLINIC_OR_DEPARTMENT_OTHER): Payer: 59 | Admitting: Family Medicine

## 2020-09-09 VITALS — BP 106/70 | HR 48 | Ht 68.0 in | Wt 145.6 lb

## 2020-09-09 DIAGNOSIS — M791 Myalgia, unspecified site: Secondary | ICD-10-CM

## 2020-09-09 DIAGNOSIS — R5383 Other fatigue: Secondary | ICD-10-CM | POA: Insufficient documentation

## 2020-09-09 LAB — SEDIMENTATION RATE: Sed Rate: 1 mm/hr (ref 0–16)

## 2020-09-09 NOTE — Assessment & Plan Note (Signed)
Uncertain etiology, initial laboratory testing was unremarkable with normal CBC, CMP, TSH Will check baseline creatine kinase at this time, Lyme titers, anti-ANA, ESR Will also plan to check CK during next episode of significant muscle soreness, patient to contact office when this occurs and come in for same-day lab draw Plan for follow-up in about 4 to 6 weeks or sooner as needed 

## 2020-09-09 NOTE — Progress Notes (Signed)
    Procedures performed today:    None.  Independent interpretation of notes and tests performed by another provider:   None.  Brief History, Exam, Impression, and Recommendations:    Tony Merritt is a 45 year old male presenting for follow-up of myalgias and fatigue.  Continues to be symptomatic.  Has reduced some of his exercise volume.  Still occasionally getting longer runs of about 8 to 10 miles and cycling to work which is about 6 miles 1 way.  Did complete a half marathon distance from about 2 weeks ago and developed significant muscle soreness and pain about 4 days after.  Has not noticed any urinary changes, no darker color or other issues.  Occasionally will have some headaches, no significant headache with increased muscle soreness.  BP 106/70   Pulse (!) 48   Ht $R'5\' 8"'Ie$  (1.727 m)   Wt 145 lb 10.1 oz (66.1 kg)   SpO2 97%   BMI 22.14 kg/m   Myalgia Uncertain etiology, initial laboratory testing was unremarkable with normal CBC, CMP, TSH Will check baseline creatine kinase at this time, Lyme titers, anti-ANA, ESR Will also plan to check CK during next episode of significant muscle soreness, patient to contact office when this occurs and come in for same-day lab draw Plan for follow-up in about 4 to 6 weeks or sooner as needed  Spent 30 minutes on this patient encounter, including preparation, chart review, face-to-face counseling with patient and coordination of care, and documentation of encounter   ___________________________________________ Suzan Manon de [place], MD, ABFM, CAQSM Primary Care and [place]

## 2020-09-09 NOTE — Patient Instructions (Signed)
  Medication Instructions:  Your physician recommends that you continue on your current medications as directed. Please refer to the Current Medication list given to you today. --If you need a refill on any your medications before your next appointment, please call your pharmacy first. If no refills are authorized on file call the office.--  Lab Work: Your physician has recommended that you have lab work today: Creatinine Kinase, Lyme, ANA and an ESR If you have labs (blood work) drawn today and your tests are completely normal, you will receive your results only by: Marland Kitchen MyChart Message (if you have MyChart) OR . A phone call from our staff. Please ensure you check your voicemail in the event that you authorized detailed messages to be left on a delegated number. If you have any lab test that is abnormal or we need to change your treatment, we will call you to review the results.  Follow-Up: Your next appointment:   Your physician recommends that you schedule a follow-up appointment in: 4-6 WEEKS with Dr. de [place]am  Thanks for letting us be apart of your health journey!!  Primary Care and Sports Medicine   Dr. de [place] and Worthy Keeler, DNP, AGNP  We recommend signing up for the patient portal called "MyChart".  Sign up information is provided on this After Visit Summary.  MyChart is used to connect with patients for Virtual Visits (Telemedicine).  Patients are able to view lab/test results, encounter notes, upcoming appointments, etc.  Non-urgent messages can be sent to your provider as well.   To learn more about what you can do with MyChart, please visit --  [email or web address].    -- If you have another episode of muscle soreness call the office for a nurse visit to draw blood to repeat the Creatinine Kinase--

## 2020-09-10 LAB — CK TOTAL AND CKMB (NOT AT ARMC)
CK, MB: 5.4 ng/mL — ABNORMAL HIGH (ref 0.5–5.0)
Relative Index: 2.5 (ref 0.0–2.5)
Total CK: 214 U/L (ref 49–397)

## 2020-09-11 LAB — ANA W/REFLEX IF POSITIVE: Anti Nuclear Antibody (ANA): NEGATIVE

## 2020-09-16 LAB — B. BURGDORFI ANTIBODIES: B burgdorferi Ab IgG+IgM: NEGATIVE

## 2020-10-12 NOTE — Addendum Note (Signed)
Addended by: Rebbeca Paul C on: 10/12/2020 04:00 PM   Modules accepted: Orders

## 2020-10-21 ENCOUNTER — Ambulatory Visit (HOSPITAL_BASED_OUTPATIENT_CLINIC_OR_DEPARTMENT_OTHER): Payer: 59 | Admitting: Family Medicine

## 2020-12-01 ENCOUNTER — Encounter (HOSPITAL_BASED_OUTPATIENT_CLINIC_OR_DEPARTMENT_OTHER): Payer: Self-pay | Admitting: Family Medicine

## 2021-01-17 ENCOUNTER — Other Ambulatory Visit: Payer: Self-pay

## 2021-01-17 ENCOUNTER — Encounter (HOSPITAL_BASED_OUTPATIENT_CLINIC_OR_DEPARTMENT_OTHER): Payer: Self-pay | Admitting: Family Medicine

## 2021-01-17 ENCOUNTER — Ambulatory Visit (HOSPITAL_BASED_OUTPATIENT_CLINIC_OR_DEPARTMENT_OTHER): Payer: 59 | Admitting: Family Medicine

## 2021-01-17 DIAGNOSIS — M722 Plantar fascial fibromatosis: Secondary | ICD-10-CM

## 2021-01-17 NOTE — Progress Notes (Signed)
    Procedures performed today:    None.  Independent interpretation of notes and tests performed by another provider:   None.  Brief History, Exam, Impression, and Recommendations:    BP 110/70   Pulse (!) 56   Ht 5\' 8"  (1.727 m)   Wt 145 lb 6.4 oz (66 kg)   SpO2 98%   BMI 22.11 kg/m   Plantar fasciitis of right foot Patient has had ongoing right heel pain since about May of this year Pain is mostly over plantar aspect of right heel Will note pain with walking sometimes, first step out of bed is notably painful.  Has reduced activities, particularly related to running.  Has continued to do some cycling.  Not significantly painful with cycling. Has tried off-the-shelf insoles with only mild relief.  Has been soaking the foot and ice.  Denies any prior similar episodes. On exam, mild tenderness to palpation over plantar aspect of heel, near origin of plantar fascia.  Normal passive and active range of motion at the ankle.  Normal strength at the ankle in all planes.  Negative windlass test. History is consistent with plantar fasciitis.  Discussed treatment recommendations. Given duration of symptoms, will check x-rays to rule out bony pathology Recommend ice massage with frozen water bottle, regular stretching exercises Will refer to physical therapy for further evaluation and treatment recommendations, home exercise program as per PT Recommend use of anterior night splint Can consider use of Tuli's heel cups Avoid walking barefoot, recommend having pair of rigid, supportive shoes or sandals that can be worn indoors May continue with activities as tolerated, may try to resume running shorter distances, progress as pain allows Plan for follow-up in about 2 to 3 months to monitor progress or sooner as needed   ___________________________________________ Tony Tennison de June, MD, ABFM, CAQSM Primary Care and Sports Medicine [facility]

## 2021-01-17 NOTE — Patient Instructions (Addendum)
  Medication Instructions:  Your physician recommends that you continue on your current medications as directed. Please refer to the Current Medication list given to you today. --If you need a refill on any your medications before your next appointment, please call your pharmacy first. If no refills are authorized on file call the office.--  Referrals/Procedures/Imaging: Your physician recommends that you have an XRAY of your Right Heel. X-rays are a type of radiation called electromagnetic waves. X-ray imaging creates pictures of the inside of your body. The images show the parts of your body in different shades of black and white. This is because different tissues absorb different amounts of radiation. Calcium in bones absorbs x-rays the most, so bones look white. Fat and other soft tissues absorb less and look gray. Air absorbs the least, so lungs look black     1. You may have this done at the [facility], located in the [facility] Building on the 1st floor.    2. You do no have to have an appointment.    3. [address] [place], [place] [address]        [phone]        Monday - Friday  8:00 am - 5:00 pm 4. [facility]  [facility]. [facility] at [facility] is a premier diagnostic imaging center that offers a 64-slice CT scanner and interventional radiology services.   Follow-Up: Your next appointment:   Your physician recommends that you schedule a follow-up appointment in: 2 MONTHS with Dr. de Peru  You will receive a text message or e-mail with a link to a survey about your care and experience with [place] today! We would greatly appreciate your feedback!   Thanks for letting us be apart of your health journey!!  Primary Care and Sports Medicine   Dr. Ceasar Mons Peru   We encourage you to activate your patient portal called "MyChart".  Sign up information is provided on this After  Visit Summary.  MyChart is used to connect with patients for Virtual Visits (Telemedicine).  Patients are able to view lab/test results, encounter notes, upcoming appointments, etc.  Non-urgent messages can be sent to your provider as well. To learn more about what you can do with MyChart, please visit --  [email or web address].     -- Please try and locate Anterior Night Splints and Tuli's Heel Cups --

## 2021-01-18 NOTE — Assessment & Plan Note (Signed)
Patient has had ongoing right heel pain since about May of this year Pain is mostly over plantar aspect of right heel Will note pain with walking sometimes, first step out of bed is notably painful.  Has reduced activities, particularly related to running.  Has continued to do some cycling.  Not significantly painful with cycling. Has tried off-the-shelf insoles with only mild relief.  Has been soaking the foot and ice.  Denies any prior similar episodes. On exam, mild tenderness to palpation over plantar aspect of heel, near origin of plantar fascia.  Normal passive and active range of motion at the ankle.  Normal strength at the ankle in all planes.  Negative windlass test. History is consistent with plantar fasciitis.  Discussed treatment recommendations. Given duration of symptoms, will check x-rays to rule out bony pathology Recommend ice massage with frozen water bottle, regular stretching exercises Will refer to physical therapy for further evaluation and treatment recommendations, home exercise program as per PT Recommend use of anterior night splint Can consider use of Tuli's heel cups Avoid walking barefoot, recommend having pair of rigid, supportive shoes or sandals that can be worn indoors May continue with activities as tolerated, may try to resume running shorter distances, progress as pain allows Plan for follow-up in about 2 to 3 months to monitor progress or sooner as needed

## 2021-01-19 ENCOUNTER — Telehealth (HOSPITAL_BASED_OUTPATIENT_CLINIC_OR_DEPARTMENT_OTHER): Payer: Self-pay

## 2021-01-19 DIAGNOSIS — M722 Plantar fascial fibromatosis: Secondary | ICD-10-CM

## 2021-01-19 NOTE — Telephone Encounter (Signed)
Patient called in requesting an alternative PT referral due to his availability and work schedule Discussed with Dr. de Peru and ok to refer to [facility] Patient given contact information for new referral

## 2021-01-21 ENCOUNTER — Ambulatory Visit
Admission: RE | Admit: 2021-01-21 | Discharge: 2021-01-21 | Disposition: A | Discharge status: Home / Self Care | Payer: 59 | Source: Ambulatory Visit | Attending: Family Medicine | Admitting: Family Medicine

## 2021-02-09 ENCOUNTER — Telehealth (HOSPITAL_BASED_OUTPATIENT_CLINIC_OR_DEPARTMENT_OTHER): Payer: Self-pay

## 2021-02-09 NOTE — Telephone Encounter (Signed)
-----   Message from Hosie Poisson Peru, MD sent at 02/04/2021  1:02 PM EDT ----- Overall imaging is unremarkable.  Small calcaneal spur noted, which is a relatively common finding and not necessarily associated with current symptoms no findings to suggest current stress fracture.  Would continue with conservative treatment

## 2021-02-09 NOTE — Telephone Encounter (Signed)
Patient is aware and agreeable to imaging results and recommendations 

## 2021-03-21 ENCOUNTER — Other Ambulatory Visit: Payer: Self-pay

## 2021-03-21 ENCOUNTER — Ambulatory Visit (HOSPITAL_BASED_OUTPATIENT_CLINIC_OR_DEPARTMENT_OTHER): Payer: 59 | Admitting: Family Medicine

## 2021-03-21 ENCOUNTER — Encounter (HOSPITAL_BASED_OUTPATIENT_CLINIC_OR_DEPARTMENT_OTHER): Payer: Self-pay | Admitting: Family Medicine

## 2021-03-21 VITALS — BP 120/84 | HR 54 | Ht 68.0 in | Wt 146.0 lb

## 2021-03-21 DIAGNOSIS — Z0000 Encounter for general adult medical examination without abnormal findings: Secondary | ICD-10-CM

## 2021-03-21 DIAGNOSIS — M722 Plantar fascial fibromatosis: Secondary | ICD-10-CM | POA: Diagnosis not present

## 2021-03-21 NOTE — Patient Instructions (Signed)
°  Medication Instructions:  °Your physician recommends that you continue on your current medications as directed. Please refer to the Current Medication list given to you today. °--If you need a refill on any your medications before your next appointment, please call your pharmacy first. If no refills are authorized on file call the office.-- °Follow-Up: °Your next appointment:   °Your physician recommends that you schedule a follow-up appointment in: 2 MONTHS for CPE with Dr. de Cuba ° °You will receive a text message or e-mail with a link to a survey about your care and experience with us today! We would greatly appreciate your feedback!  ° °Thanks for letting us be apart of your health journey!!  °Primary Care and Sports Medicine  ° °Dr. Raymond de Cuba  ° °We encourage you to activate your patient portal called "MyChart".  Sign up information is provided on this After Visit Summary.  MyChart is used to connect with patients for Virtual Visits (Telemedicine).  Patients are able to view lab/test results, encounter notes, upcoming appointments, etc.  Non-urgent messages can be sent to your provider as well. To learn more about what you can do with MyChart, please visit --  [email or web address]   ° °

## 2021-03-21 NOTE — Progress Notes (Signed)
    Procedures performed today:    None.  Independent interpretation of notes and tests performed by another provider:   None.  Brief History, Exam, Impression, and Recommendations:    BP 120/84   Pulse (!) 54   Ht 5\' 8"  (1.727 m)   Wt 146 lb (66.2 kg)   SpO2 98%   BMI 22.20 kg/m   Plantar fasciitis of right foot Overall, has been doing well, reports that pain is about 80% improved Did work with physical therapy, advised on home exercise program, has been doing this on his own Occasionally will have some soreness if he has a day of prolonged standing at work Pain is not limiting him from any activities, although he has lessened his exercise somewhat due to less daylight hours Recommend continuing with home exercise program, gradual progression of activities as tolerated  Plan for wellness visit in about 2 months with nurse visit completed 1 week prior for labs   ___________________________________________ Tony Watterson de , MD, ABFM, CAQSM Primary Care and Sports Medicine [facility]

## 2021-03-21 NOTE — Assessment & Plan Note (Signed)
Overall, has been doing well, reports that pain is about 80% improved Did work with physical therapy, advised on home exercise program, has been doing this on his own Occasionally will have some soreness if he has a day of prolonged standing at work Pain is not limiting him from any activities, although he has lessened his exercise somewhat due to less daylight hours Recommend continuing with home exercise program, gradual progression of activities as tolerated

## 2021-05-18 ENCOUNTER — Other Ambulatory Visit: Payer: Self-pay

## 2021-05-18 ENCOUNTER — Ambulatory Visit (HOSPITAL_BASED_OUTPATIENT_CLINIC_OR_DEPARTMENT_OTHER): Payer: 59

## 2021-05-23 ENCOUNTER — Other Ambulatory Visit: Payer: Self-pay

## 2021-05-23 ENCOUNTER — Encounter (HOSPITAL_BASED_OUTPATIENT_CLINIC_OR_DEPARTMENT_OTHER): Payer: Self-pay | Admitting: Family Medicine

## 2021-05-23 ENCOUNTER — Ambulatory Visit (INDEPENDENT_AMBULATORY_CARE_PROVIDER_SITE_OTHER): Payer: 59 | Admitting: Family Medicine

## 2021-05-23 VITALS — BP 112/70 | HR 64 | Ht 68.0 in | Wt 148.0 lb

## 2021-05-23 DIAGNOSIS — Z0000 Encounter for general adult medical examination without abnormal findings: Secondary | ICD-10-CM

## 2021-05-23 DIAGNOSIS — Z23 Encounter for immunization: Secondary | ICD-10-CM

## 2021-05-23 LAB — ANA: ANA Titer 1: NEGATIVE

## 2021-05-23 LAB — CREATININE KINASE MB: CK-MB Index: 5.4 ng/mL (ref 0.0–10.4)

## 2021-05-23 NOTE — Patient Instructions (Signed)
°  Medication Instructions:  °Your physician recommends that you continue on your current medications as directed. Please refer to the Current Medication list given to you today. °--If you need a refill on any your medications before your next appointment, please call your pharmacy first. If no refills are authorized on file call the office.-- ° °Follow-Up: °Your next appointment:   °Your physician recommends that you schedule a follow-up appointment in: 1 YEAR for CPE with Dr. de Cuba ° °You will receive a text message or e-mail with a link to a survey about your care and experience with us today! We would greatly appreciate your feedback!  ° °Thanks for letting us be apart of your health journey!!  °Primary Care and Sports Medicine  ° °Dr. Raymond de Cuba  ° °We encourage you to activate your patient portal called "MyChart".  Sign up information is provided on this After Visit Summary.  MyChart is used to connect with patients for Virtual Visits (Telemedicine).  Patients are able to view lab/test results, encounter notes, upcoming appointments, etc.  Non-urgent messages can be sent to your provider as well. To learn more about what you can do with MyChart, please visit --  [email or web address]   ° °

## 2021-05-23 NOTE — Assessment & Plan Note (Addendum)
The patient was counseled, risk factors were discussed, anticipatory guidance given Up-to-date with annual flu shot, needs tetanus, interested in receiving today Appears that patient had labs completed last week, however did not have all labs completed, will reach out to laboratory in order to see if the remainder of the labs can be added on or if new blood sample is needed Discussed general recommendations regarding healthy lifestyle, patient does maintain regular physical activity, encouraged to continue with this Has had some ongoing abdominal pain which seems to be related to core muscle strain.  Recommend relative rest, conservative measures, proceeding with activities as tolerated

## 2021-05-23 NOTE — Progress Notes (Signed)
Subjective:    CC: Annual Physical Exam  HPI:  Tony Merritt is a 46 y.o. presenting for annual physical  I reviewed the past medical history, family history, social history, surgical history, and allergies today and no changes were needed.  Please see the problem list section below in epic for further details.  Past Medical History: History reviewed. No pertinent past medical history. Past Surgical History: Past Surgical History:  Procedure Laterality Date   KNEE SURGERY     SHOULDER ARTHROSCOPY     Social History: Social History   Socioeconomic History   Marital status: Married    Spouse name: Not on file   Number of children: Not on file   Years of education: Not on file   Highest education level: Not on file  Occupational History   Not on file  Tobacco Use   Smoking status: Never   Smokeless tobacco: Never  Vaping Use   Vaping Use: Never used  Substance and Sexual Activity   Alcohol use: Not Currently   Drug use: Not Currently   Sexual activity: Yes    Birth control/protection: None  Other Topics Concern   Not on file  Social History Narrative   Not on file   Social Determinants of Health   Financial Resource Strain: Not on file  Food Insecurity: Not on file  Transportation Needs: Not on file  Physical Activity: Not on file  Stress: Not on file  Social Connections: Not on file   Family History: Family History  Problem Relation Age of Onset   Healthy Mother    Healthy Father    Cancer Maternal Uncle        deceased at 75 - unknown type of cancer   Cancer Maternal Grandmother        deceased at age 48 - cancer type unknown   Heart attack Maternal Grandfather        deceased at age 67   Heart attack Paternal Grandmother        deceased at age 39   Heart attack Paternal Grandfather        deceased at age 58   Allergies: Allergies  Allergen Reactions   Shellfish Allergy Anaphylaxis   Shrimp (Diagnostic) Anaphylaxis   Medications: See  med rec.  Review of Systems: No headache, visual changes, nausea, vomiting, diarrhea, constipation, dizziness, abdominal pain, skin rash, fevers, chills, night sweats, swollen lymph nodes, weight loss, chest pain, body aches, joint swelling, muscle aches, shortness of breath, mood changes, visual or auditory hallucinations.  Objective:    BP 112/70    Pulse 64    Ht 5\' 8"  (1.727 m)    Wt 148 lb (67.1 kg)    SpO2 97%    BMI 22.50 kg/m   General: Well Developed, well nourished, and in no acute distress.  Neuro: Alert and oriented x3, extra-ocular muscles intact, sensation grossly intact. Cranial nerves II through XII are intact, motor, sensory, and coordinative functions are all intact. HEENT: Normocephalic, atraumatic, pupils equal round reactive to light, neck supple, no masses, no lymphadenopathy, thyroid nonpalpable. Oropharynx, nasopharynx, external ear canals are unremarkable. Skin: Warm and dry, no rashes noted.  Cardiac: Regular rate and rhythm, no murmurs rubs or gallops.  Respiratory: Clear to auscultation bilaterally. Not using accessory muscles, speaking in full sentences.  Abdominal: Soft, nondistended, positive bowel sounds, no masses, no organomegaly.  Mild tenderness palpation at inferior margin of right chest wall, extending primarily from mid clavicular area to mid axillary  area.  No obvious crepitus over this area.  Negative hook maneuver. Musculoskeletal: Shoulder, elbow, wrist, hip, knee, ankle stable, and with full range of motion.  Impression and Recommendations:    Wellness examination The patient was counseled, risk factors were discussed, anticipatory guidance given Up-to-date with annual flu shot, needs tetanus, interested in receiving today Appears that patient had labs completed last week, however did not have all labs completed, will reach out to laboratory in order to see if the remainder of the labs can be added on or if new blood sample is needed Discussed  general recommendations regarding healthy lifestyle, patient does maintain regular physical activity, encouraged to continue with this Has had some ongoing abdominal pain which seems to be related to core muscle strain.  Recommend relative rest, conservative measures, proceeding with activities as tolerated  Plan for follow-up in about 1 year for CPE or sooner as needed.  We will complete labs about 1 week prior to CPE including CBC, CMP, lipid panel   ___________________________________________ Tony Rapap[place], MD, ABFM, CAQSM Primary Care and Sports Medicine [facility]

## 2021-05-23 NOTE — Addendum Note (Signed)
Addended by: Dareen Piano on: 05/23/2021 02:56 PM   Modules accepted: Orders

## 2022-04-13 IMAGING — CR DG OS CALCIS 2+V*R*
2 series · 2 of 2 positions shown · non-contrast
Comparison: None.

CLINICAL DATA: Chronic right heel pain.

EXAM:
RIGHT OS CALCIS - 2+ VIEW

[t calcaneus axial right]
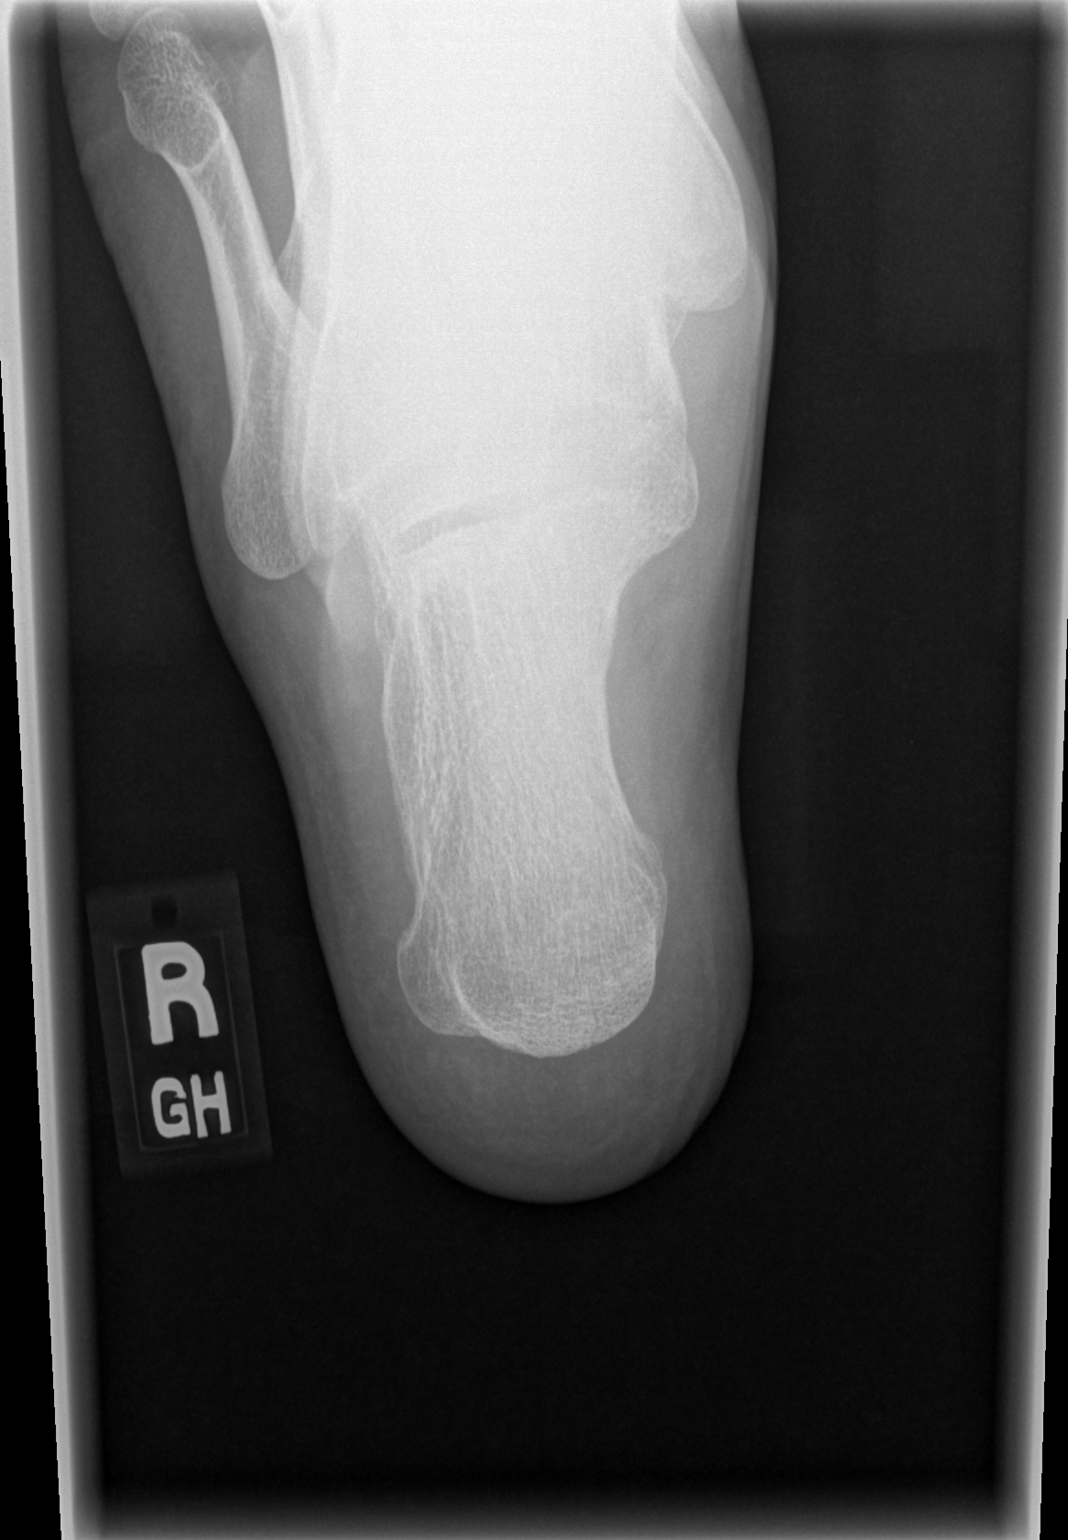

[t calcaneus lat right]
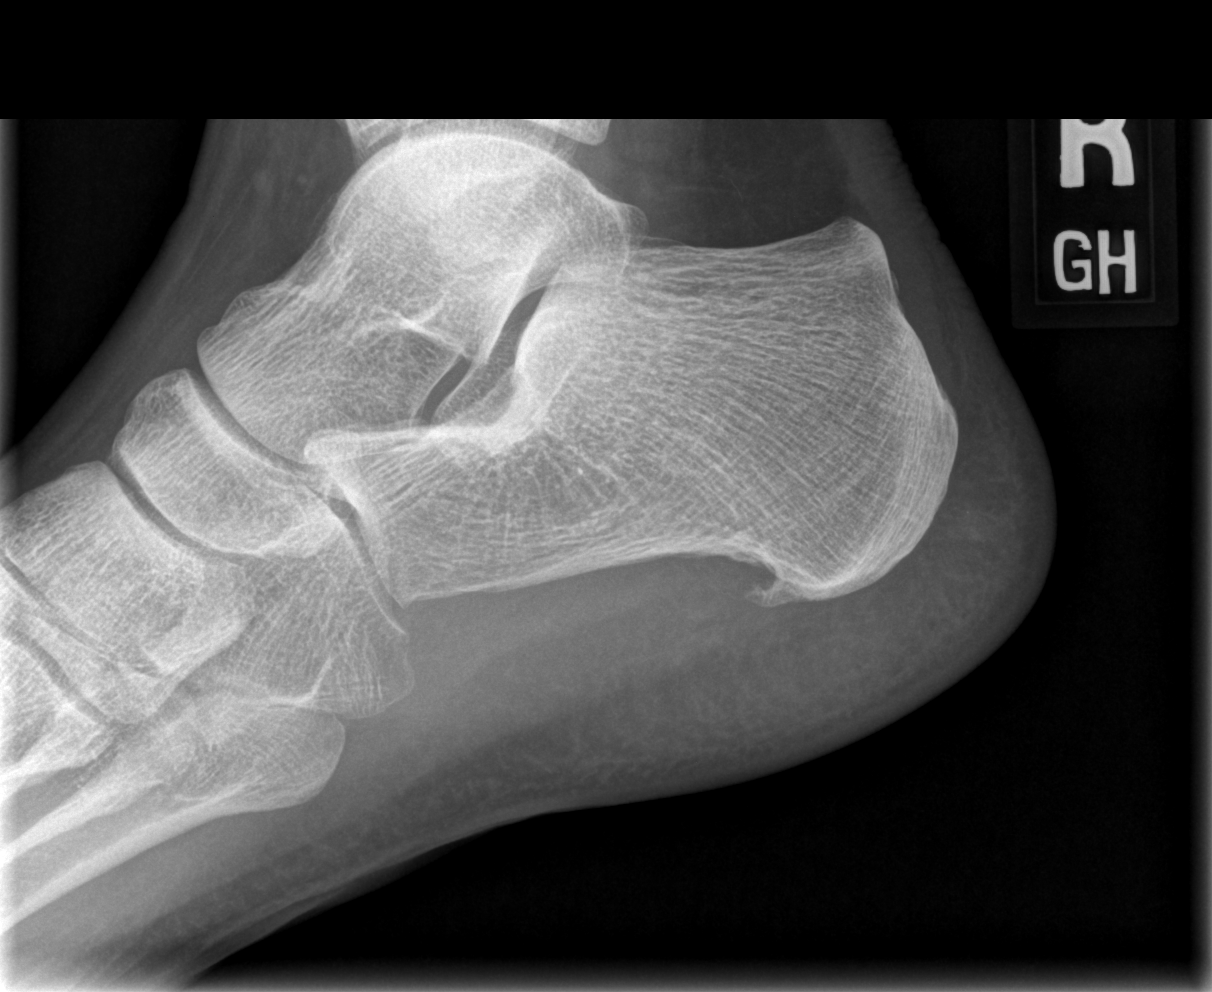

[2 of 2 positions shown; findings below may reference images not displayed]

FINDINGS: Small inferior calcaneal spur is present. No acute fracture. No
additional focal bony abnormality. Soft tissues are unremarkable.
IMPRESSION: 1. No acute findings.
2. Small inferior calcaneal spur.

## 2022-05-26 ENCOUNTER — Encounter (HOSPITAL_BASED_OUTPATIENT_CLINIC_OR_DEPARTMENT_OTHER): Payer: 59 | Admitting: Family Medicine

## 2022-05-30 ENCOUNTER — Emergency Department (HOSPITAL_COMMUNITY): Payer: 59

## 2022-05-30 ENCOUNTER — Other Ambulatory Visit: Payer: Self-pay | Admitting: Cardiology

## 2022-05-30 ENCOUNTER — Other Ambulatory Visit (INDEPENDENT_AMBULATORY_CARE_PROVIDER_SITE_OTHER): Payer: 59

## 2022-05-30 ENCOUNTER — Other Ambulatory Visit: Payer: Self-pay | Admitting: Physician Assistant

## 2022-05-30 ENCOUNTER — Emergency Department (HOSPITAL_BASED_OUTPATIENT_CLINIC_OR_DEPARTMENT_OTHER): Payer: 59

## 2022-05-30 ENCOUNTER — Encounter (HOSPITAL_BASED_OUTPATIENT_CLINIC_OR_DEPARTMENT_OTHER): Payer: 59 | Admitting: Family Medicine

## 2022-05-30 ENCOUNTER — Emergency Department (HOSPITAL_COMMUNITY)
Admission: EM | Admit: 2022-05-30 | Discharge: 2022-05-30 | Disposition: A | Discharge status: Home / Self Care | Payer: 59 | Attending: Emergency Medicine | Admitting: Emergency Medicine

## 2022-05-30 DIAGNOSIS — L03032 Cellulitis of left toe: Secondary | ICD-10-CM | POA: Diagnosis not present

## 2022-05-30 DIAGNOSIS — I422 Other hypertrophic cardiomyopathy: Secondary | ICD-10-CM

## 2022-05-30 DIAGNOSIS — R55 Syncope and collapse: Secondary | ICD-10-CM

## 2022-05-30 DIAGNOSIS — R739 Hyperglycemia, unspecified: Secondary | ICD-10-CM | POA: Diagnosis not present

## 2022-05-30 DIAGNOSIS — R7309 Other abnormal glucose: Secondary | ICD-10-CM

## 2022-05-30 LAB — BASIC METABOLIC PANEL
Anion gap: 7 (ref 5–15)
BUN: 27 mg/dL — ABNORMAL HIGH (ref 6–20)
CO2: 27 mmol/L (ref 22–32)
Calcium: 9.1 mg/dL (ref 8.9–10.3)
Chloride: 106 mmol/L (ref 98–111)
Creatinine, Ser: 1.24 mg/dL (ref 0.61–1.24)
GFR, Estimated: >60 mL/min (ref >60)
Glucose, Bld: 115 mg/dL — ABNORMAL HIGH (ref 70–99)
Potassium: 4.5 mmol/L (ref 3.5–5.1)
Sodium: 140 mmol/L (ref 135–145)

## 2022-05-30 LAB — CBC WITH DIFFERENTIAL/PLATELET
Abs Immature Granulocytes: 0.01 10*3/uL (ref 0.00–0.07)
Basophils Absolute: 0 10*3/uL (ref 0.0–0.1)
Basophils Relative: 1 %
Eosinophils Absolute: 0.1 10*3/uL (ref 0.0–0.5)
Eosinophils Relative: 3 %
HCT: 42.8 % (ref 39.0–52.0)
Hemoglobin: 15.4 g/dL (ref 13.0–17.0)
Immature Granulocytes: 0 %
Lymphocytes Relative: 38 %
Lymphs Abs: 1.6 10*3/uL (ref 0.7–4.0)
MCH: 31.4 pg (ref 26.0–34.0)
MCHC: 36 g/dL (ref 30.0–36.0)
MCV: 87.3 fL (ref 80.0–100.0)
Monocytes Absolute: 0.3 10*3/uL (ref 0.1–1.0)
Monocytes Relative: 8 %
Neutro Abs: 2 10*3/uL (ref 1.7–7.7)
Neutrophils Relative %: 50 %
Platelets: 155 10*3/uL (ref 150–400)
RBC: 4.9 MIL/uL (ref 4.22–5.81)
RDW: 11.6 % (ref 11.5–15.5)
WBC: 4.1 10*3/uL (ref 4.0–10.5)
nRBC: 0 % (ref 0.0–0.2)

## 2022-05-30 LAB — ECHOCARDIOGRAM COMPLETE
Area-P 1/2: 3.39 cm2
S' Lateral: 2.9 cm

## 2022-05-30 LAB — D-DIMER, QUANTITATIVE: D-Dimer, Quant: <0.27 ug/mL-FEU (ref 0.00–0.50)

## 2022-05-30 LAB — TROPONIN I (HIGH SENSITIVITY)
Troponin I (High Sensitivity): 6 ng/L (ref <18)
Troponin I (High Sensitivity): 12 ng/L (ref <18)

## 2022-05-30 MED ORDER — DOXYCYCLINE HYCLATE 100 MG PO TABS
100.0000 mg | ORAL_TABLET | Freq: Once | ORAL | Status: AC
  Administered 2022-05-30: 100 mg via ORAL
  Filled 2022-05-30: qty 1

## 2022-05-30 MED ORDER — PERFLUTREN LIPID MICROSPHERE
1.0000 mL | INTRAVENOUS | Status: AC | PRN
  Administered 2022-05-30: 3 mL via INTRAVENOUS

## 2022-05-30 MED ORDER — DOXYCYCLINE HYCLATE 100 MG PO CAPS: 100.0000 mg | ORAL_CAPSULE | Freq: Two times a day (BID) | ORAL | 0 refills | Status: DC

## 2022-05-30 NOTE — Progress Notes (Unsigned)
Enrolled for [employer] to mail a ZIO AT Live Telemetry monitor to patients address on file.   Dr. Margaretann Loveless to read.

## 2022-05-30 NOTE — ED Triage Notes (Signed)
Pt to ED via EMS from home. Pt got up out of bed d/t left little toe hurting when he had syncopal episode. Pt's wife found him face down on floor for about 3 minutes. Pt was pale and diaphoretic per EMS. EMS also noted pt bradycardic at 30-50 and hypotensive at 106/62. Pt denies CP. Pt endorses nausea no vomiting. Pt's left little toe swollen and red. EMS states negative orthostatic vitals.   EMS Vitals: 500cc NS 30-50 HR 106/62 118/90 86 CBG 18 LAC 97% RA

## 2022-05-30 NOTE — ED Triage Notes (Signed)
EMS gave 4 zofran en route

## 2022-05-30 NOTE — ED Notes (Signed)
Warm blankets applied to pt.

## 2022-05-30 NOTE — ED Notes (Signed)
Ice pack applied to left pinky toe

## 2022-05-30 NOTE — Progress Notes (Signed)
Echocardiogram 2D Echocardiogram has been performed.  Oneal Deputy Mikell Camp RDCS 05/30/2022, 2:20 PM

## 2022-05-30 NOTE — ED Provider Notes (Signed)
[facility] Provider Note   CSN: [id number] Arrival date & time: 05/30/22  0407     History  Chief Complaint  Patient presents with   Loss of Consciousness    Tony Merritt is a 47 y.o. male.  The history is provided by the patient and the EMS personnel.  Loss of Consciousness He has been having pain in his left fifth toe.  Tonight, he was having pain in his toe when he got up and apparently had a syncopal episode.  He does not recall anything about it but his wife states that he was unconscious for an estimated 3 minutes.  He will had nausea, pallor, diaphoresis.  He feels better now.  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  He denies fever or chills.  He denies chest pain, heaviness, tightness, pressure.  EMS noted bradycardia with heart rate 30-50 and low blood pressure of 106/62 not nausea.  He was given IV fluids, ondansetron.  Of note, he states that he is a runner and had run a marathon 2 months ago.   Home Medications Prior to Admission medications   Not on File      Allergies    Shellfish allergy and Shrimp (diagnostic)    Review of Systems   Review of Systems  Cardiovascular:  Positive for syncope.  All other systems reviewed and are negative.   Physical Exam Updated Vital Signs BP 110/75   Pulse (!) 43   Temp (!) 96.9 F (36.1 C) (Rectal)   Resp 10   SpO2 98%  Physical Exam Vitals and nursing note reviewed.   47 year old male, resting comfortably and in no acute distress. Vital signs are significant for slow heart rate. Oxygen saturation is 98%, which is normal. Head is normocephalic and atraumatic. PERRLA, EOMI. Oropharynx is clear. Neck is nontender and supple without adenopathy or JVD. Back is nontender and there is no CVA tenderness. Lungs are clear without rales, wheezes, or rhonchi. Chest is nontender. Heart is bradycardic  without murmur. Abdomen is soft, flat, nontender without masses or hepatosplenomegaly and peristalsis is normoactive. Extremities have no cyanosis or edema, full range of motion is present.  There is deformity of the fifth toenails bilaterally.  Left fifth toe is erythematous, slightly swollen, and very tender without lymphangitic streaks. Skin is warm and dry without rash. Neurologic: Mental status is normal, cranial nerves are intact, moves all extremities equally.  ED Results / Procedures / Treatments   Labs (all labs ordered are listed, but only abnormal results are displayed) Labs Reviewed  BASIC METABOLIC PANEL - Abnormal; Notable for the following components:      Result Value   Glucose, Bld 115 (*)    BUN 27 (*)    All other components within normal limits  CBC WITH DIFFERENTIAL/PLATELET  TROPONIN I (HIGH SENSITIVITY)    EKG EKG Interpretation  Date/Time:  Tuesday May 30 2022 04:23:17 EST Ventricular Rate:  44 PR Interval:  214 QRS Duration: 91 QT Interval:  541 QTC Calculation: 463 R Axis:   84 Text Interpretation: Sinus bradycardia Borderline prolonged PR interval Probable left atrial enlargement Probable LVH with secondary repol abnrm Abnormal T, probable ischemia, lateral leads When compared with ECG of 06/11/2013, T wave inversion Inferior leads is now present Confirmed by Delora Fuel ([id number]) on 05/30/2022 6:07:58 AM  Radiology DG Toe 5th Left  Result Date: 05/30/2022 CLINICAL DATA:  47 year old male with history of pain, swelling and redness in the left fifth toe. EXAM: DG TOE 5TH LEFT COMPARISON:  No priors. 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 No destructive changes are noted in the visualized bones. Mild diffuse soft tissue swelling identified in the left fifth toe. No acute displaced fracture, subluxation or dislocation. IMPRESSION: 1. Diffuse soft tissue swelling in the left fifth toe without  underlying acute osseous abnormality. Electronically Signed   By: Vinnie Langton M.D.   On: 05/30/2022 06:40    Procedures Procedures  Cardiac monitor shows sinus bradycardia, per my interpretation.  Medications Ordered in ED Medications  doxycycline (VIBRA-TABS) tablet 100 mg (100 mg Oral Given 05/30/22 9702)    ED Course/ Medical Decision Making/ A&P                             Medical Decision Making Amount and/or Complexity of Data Reviewed Labs: ordered. Radiology: ordered.  Risk Prescription drug management.   Syncope which appears to be vasovagal.  I have low index of suspicion of serious pathology such as cardiac arrhythmia, ACS, pulmonary embolism.  Pain and erythema and swelling of the left fifth toe likely low-grade cellulitis.  His bradycardia is likely secondary to the fact that he is a long-distance runner.  I have reviewed and interpreted his electrocardiogram, my interpretation is sinus bradycardia but with T wave inversions in the inferior leads which had not been present prior ECG in 2015.  I have reviewed his past records, and had an urgent care visit on 11/30/2021 heart rate was 40.  I believe that his bradycardia is physiologic related to his running.  However, with new ECG changes, I have ordered laboratory workup of CBC, basic metabolic panel, troponin x 2.  I have also ordered x-rays of his left fifth toe.  I am ordering initial dose of doxycycline for presumed cellulitis.  X-rays of the left fifth toe shows soft tissue swelling but no fracture.  I independently viewed the images, and agree with radiologist interpretation.  I have reviewed and interpreted his laboratory test, and my interpretation is elevated random glucose level, otherwise normal basic metabolic panel, normal CBC, normal troponin with repeat troponin pending.  I have ordered initial dose of doxycycline for presumed cellulitis of his toe.  Case is signed out to Dr. Wyvonnia Dusky pending delta  troponin.  Final Clinical Impression(s) / ED Diagnoses Final diagnoses:  Vasovagal syncope  Cellulitis of fifth toe of left foot  Elevated random blood glucose level    Rx / DC Orders ED Discharge Orders     None         Delora Fuel, MD 63/78/58 [phone]

## 2022-05-30 NOTE — ED Provider Notes (Signed)
Care assumed from Dr. Roxanne Mins.  Patient here with syncopal episode with without prodrome.  Found to have left fifth toe cellulitis.  Syncope with heart rate in the 40s, T wave inversions inferiorly and laterally with prolonged QT.  Awaiting discussion with cardiology. D-dimer negative.  Troponin 6 then 12.  Echocardiogram with normal ejection fraction. 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 The degree of hypertrophy is mild and  there is no apical aneurysm formation/entrapment yet. May be early in the  development of the full phenotypic  changes. Suggest correlation with cardiac MRI.   Pending evaluation by cardiology attending at shift change. Disposition per cardiology.  Dr. Vanita Panda to assume care.  If going home will need doxycycline to treat his toe cellulitis.   Ezequiel Essex, MD 05/30/22 [phone]

## 2022-05-30 NOTE — Discharge Instructions (Signed)
The office will call you to arrange a cardiac MRI to evaluate your heart muscle to help determine reason for syncope.       Heart Monitor:   Length of Wear: 14 days   Your monitor you have been given info on obtaining if you have questions please send [place] a MyChart message or call the office at [phone], so we may follow up on this for you.    Your physician has recommended that you wear a Zio AT monitor.    This monitor is a medical device that records the heart's electrical activity. Doctors most often use these monitors to diagnose arrhythmias. Arrhythmias are problems with the speed or rhythm of the heartbeat. The monitor is a small device applied to your chest. You can wear one while you do your normal daily activities. While wearing this monitor if you have any symptoms to push the button and record what you felt. Once you have worn this monitor for the period of time provider prescribed (Usually 14 days), you will return the monitor device in the postage paid box. Once it is returned they will download the data collected and provide [place] with a report which the provider will then review and we will call you with those results. Important tips:   1. Avoid showering during the first 24 hours of wearing the monitor. 2. Avoid excessive sweating to help maximize wear time. 3. Do not submerge the device, no hot tubs, and no swimming pools. 4. Keep any lotions or oils away from the patch. 5. After 24 hours you may shower with the patch on. Take brief showers with your back facing the shower head.  6. Do not remove patch once it has been placed because that will interrupt data and decrease adhesive wear time. 7. Push the button when you have any symptoms and write down what you were feeling. 8. Once you have completed wearing your monitor, remove and place into box which has postage paid and place in your outgoing mailbox.  9. If for some reason you have misplaced your box then call our  office and we can provide another box and/or mail it off for you.

## 2022-05-30 NOTE — Consult Note (Addendum)
Cardiology Consultation   Patient ID: Tony Merritt MRN: [id number]; DOB: [birth year]  Admit date: 05/30/2022 Date of Consult: 05/30/2022  PCP:  de [place], Raymond J, MD   [facility] Cardiologist:  Dr. Margaretann Loveless     Patient Profile:   Tony Merritt is a 47 y.o. male with a prior history of syncope who is being seen 05/30/2022 for the evaluation of syncope at the request of Dr. Wyvonnia Dusky.  History of Present Illness:   Mr. 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  He had a prior syncopal episode and was seen in the ED in February 2015.  He has no prior cardiac history.  2 months ago, he just finished a marathon and was able to run 26 miles without exertional symptoms.  He denies any recent exertional chest pain, shortness of breath, palpitation, fever, chill or cough.  He has a history of relative bradycardia.  When he was seen in urgent care in August 2023, his heart rate was 40 bpm, blood pressure was 120/75 at the time.  He says sometimes after he finished running, within the first 15 seconds after he stops, he may feel a little woozy.  He denies any family history of sudden cardiac death, although one of his grandfather had early heart attack in his 55s.  He does not smoke, drink or do any illicit drug.  He was in his usual state of health until around 3 AM this morning.  He has some pain in the left small toe, he got up to check on it.  When he got up, he had a flushing sensation, diaphoresis, nausea and to feel the room was spinning.  Before he could react, he passed out onto the floor.  He does not remember falling down.  His wife found him and was able to wake him up 3 minutes later.  Upon waking up, he continued to feel nauseated.  He sought medical attention at [facility], ED.  Serial troponin negative, 6--> 12.  Electrolytes normal.  Glucose of 115.  Creatinine 1.24.   CBC normal.  D-dimer negative.  X-ray of fifth toe showed diffuse soft tissue swelling without underlying acute osseous abnormality.  He was given a single dose of doxycycline for possible cellulitis.  EKG showed a sinus bradycardia with T wave inversion in the inferior and lateral leads.  While on the telemetry, it was noted that his heart rate is persistently in the high 40s to 70s, with occasional drop down to the high 30s.     No past medical history on file.  Past Surgical History:  Procedure Laterality Date   KNEE SURGERY     SHOULDER ARTHROSCOPY       Home Medications:  Prior to Admission medications   Medication Sig Start Date End Date Taking? Authorizing Provider  Cholecalciferol (VITAMIN D-3 PO) Take 1 capsule by mouth daily.   Yes [provider]    Inpatient Medications: Scheduled Meds:  Continuous Infusions:  PRN Meds:   Allergies:    Allergies  Allergen Reactions   Shellfish Allergy Anaphylaxis and Swelling    Swelling of mouth, tongue, throat   Shrimp (Diagnostic) Anaphylaxis and Swelling    Swelling of mouth, tongue, throat    Social History:   Social History   Socioeconomic History   Marital status: Married    Spouse name: Not on file   Number of children:  Not on file   Years of education: Not on file   Highest education level: Not on file  Occupational History   Not on file  Tobacco Use   Smoking status: Never   Smokeless tobacco: Never  Vaping Use   Vaping Use: Never used  Substance and Sexual Activity   Alcohol use: Not Currently   Drug use: Not Currently   Sexual activity: Yes    Birth control/protection: None  Other Topics Concern   Not on file  Social History Narrative   Not on file   Social Determinants of Health   Financial Resource Strain: Not on file  Food Insecurity: Not on file  Transportation Needs: Not on file  Physical Activity: Not on file  Stress: Not on file  Social Connections: Not on file  Intimate  Partner Violence: Not on file    Family History:    Family History  Problem Relation Age of Onset   Healthy Mother    Healthy Father    Cancer Maternal Uncle        deceased at 28 - unknown type of cancer   Cancer Maternal Grandmother        deceased at age 35 - cancer type unknown   Heart attack Maternal Grandfather        deceased at age 63   Heart attack Paternal Grandmother        deceased at age 34   Heart attack Paternal Grandfather        deceased at age 72     ROS:  Please see the history of present illness.   All other ROS reviewed and negative.     Physical Exam/Data:   Vitals:   05/30/22 0839 05/30/22 0900 05/30/22 1000 05/30/22 1200  BP:  111/73 113/73 112/80  Pulse:  (!) 47 (!) 48 (!) 50  Resp:  16 17 13   Temp: (!) 97.5 F (36.4 C)     TempSrc: Oral     SpO2:  94% 100% 97%   No intake or output data in the 24 hours ending 05/30/22 1218    05/23/2021    1:42 PM 03/21/2021    3:59 PM 01/17/2021    4:11 PM  Last 3 Weights  Weight (lbs) 148 lb 146 lb 145 lb 6.4 oz  Weight (kg) 67.132 kg 66.225 kg 65.953 kg     There is no height or weight on file to calculate BMI.  General:  Well nourished, well developed, in no acute distress HEENT: normal Neck: no JVD Vascular: No carotid bruits; Distal pulses 2+ bilaterally Cardiac:  normal S1, S2; RRR; no murmur  Lungs:  clear to auscultation bilaterally, no wheezing, rhonchi or rales  Abd: soft, nontender, no hepatomegaly  Ext: no edema Musculoskeletal:  No deformities, BUE and BLE strength normal and equal Skin: warm and dry  Neuro:  CNs 2-12 intact, no focal abnormalities noted Psych:  Normal affect   EKG:  The EKG was personally reviewed and demonstrates: Normal sinus rhythm, T wave inversion in the inferior and lateral leads Telemetry:  Telemetry was personally reviewed and demonstrates: Sinus bradycardia, heart rate in the 40s, occasional heart rate down to the high 30s.  Relevant CV  Studies:  N/A  Laboratory Data:  High Sensitivity Troponin:   Recent Labs  Lab 05/30/22 0425 05/30/22 0802  TROPONINIHS 6 12     Chemistry Recent Labs  Lab 05/30/22 0425  NA 140  K 4.5  CL 106  CO2 27  GLUCOSE 115*  BUN 27*  CREATININE 1.24  CALCIUM 9.1  GFRNONAA >60  ANIONGAP 7    No results for input(s): "PROT", "ALBUMIN", "AST", "ALT", "ALKPHOS", "BILITOT" in the last 168 hours. Lipids No results for input(s): "CHOL", "TRIG", "HDL", "LABVLDL", "LDLCALC", "CHOLHDL" in the last 168 hours.  Hematology Recent Labs  Lab 05/30/22 0425  WBC 4.1  RBC 4.90  HGB 15.4  HCT 42.8  MCV 87.3  MCH 31.4  MCHC 36.0  RDW 11.6  PLT 155   Thyroid No results for input(s): "TSH", "FREET4" in the last 168 hours.  BNPNo results for input(s): "BNP", "PROBNP" in the last 168 hours.  DDimer  Recent Labs  Lab 05/30/22 0802  DDIMER <0.27     Radiology/Studies:  DG Toe 5th Left  Result Date: 05/30/2022 CLINICAL DATA:  47 year old male with history of pain, swelling and redness in the left fifth toe. EXAM: DG TOE 5TH LEFT COMPARISON:  No priors. 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 No destructive changes are noted in the visualized bones. Mild diffuse soft tissue swelling identified in the left fifth toe. No acute displaced fracture, subluxation or dislocation. IMPRESSION: 1. Diffuse soft tissue swelling in the left fifth toe without underlying acute osseous abnormality. Electronically Signed   By: Trudie Reed M.D.   On: 05/30/2022 06:40     Assessment and Plan:   Syncope  -Suspicion for bradycardia contributing to his syncope is very low, he likely has athlete's heart and bradycardia is a testament of how efficient his heart is.  Syncope was preceded by flushing sensation, diaphoresis, dizziness, room spinning, and nausea.  Symptom is more consistent with a vasovagal episode however cannot complete rule out  arrhythmogenic.  Orthostatic vital signs in the ED was negative.  He will require a echocardiogram to rule out HOCM and heart monitor  Relative bradycardia: Baseline heart rate in the 40s in the emergency room.  However there is no evidence that he has symptomatic bradycardia.  Left fifth toe redness: Received a dose of doxycycline.  Likely related to recent marathon running  Abnormal EKG: T wave inversion noted in the inferior and lateral leads.  Will need echocardiogram to rule out hypertrophic cardiomyopathy.  If negative, will proceed with a live heart monitor.   Risk Assessment/Risk Scores:     For questions or updates, please contact [facility] Please consult [email or web address] for contact info under    Ramond Dial, [place]  05/30/2022 12:18 PM  Patient seen and examined with Azalee Course PA.  Agree as above, with the following exceptions and changes as noted below.  Patient is a pleasant athletic 47 year old gentleman who cycles, runs marathons who is seen today for syncopal episode.  History as noted above.  Patient has actually had multiple syncopal episodes in his lifetime.  He does have exertional dizziness and lightheadedness and notes that when he rides his bike on [employer] when he stops he will get lightheaded upon stopping and then has exertional difficulty resuming his activity.  His wife is in the room and provides collaborative history.  She notes that he ran a marathon in November and had a syncopal episode afterward.  He has also had a syncopal episode in 2015 with an abnormal EKG at that time, emergency department notes have been reviewed from that time.  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  Gen:  NAD, CV: RRR, no murmurs, Lungs: clear, Abd: soft, Extrem: Warm, well perfused, no edema, Neuro/Psych: alert and oriented x 3, normal mood and affect. All available labs,  radiology testing, previous records reviewed.  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  I am concerned with his history of syncope.  Right ventricle looks overall normal, no strong suspicion for arrhythmogenic cardiomyopathy at this point.  However we will obtain a cardiac MRI to risk stratify his apical hypertrophic cardiomyopathy finding, this can be accomplished as an outpatient.  Given his competitive athletic pursuits, I think a stratifying with an exercise treadmill test is also warranted.  This will of course be nondiagnostic for ischemia but will be used for arrhythmia detection.  I would also like to outfit him with a live cardiac monitor to screen for arrhythmia in the setting of hypertrophic cardiomyopathy apical subtype.  I discussed all this in detail with the patient and his wife.  I would like for him to remain out of high-level physical activity until further risk stratification can occur.  He works as an Clinical biochemist and I encouraged him to participate in ground level activities only and light duties until further testing is complete.  He understands and will do so.  I think his erythematous fifth digit of his foot is not related to his cardiac issues. Appears to have fungal nail infection/nail trauma, most likely source.   Elouise Munroe, MD 05/30/22 4:59 PM

## 2022-05-30 NOTE — ED Provider Notes (Signed)
4:44 PM Per cardiology patient appropriate for discharge.   Carmin Muskrat, MD 05/30/22 [phone]

## 2022-05-30 NOTE — ED Notes (Signed)
Pt transported to Echo. 

## 2022-05-31 ENCOUNTER — Telehealth: Payer: Self-pay | Admitting: Internal Medicine

## 2022-05-31 NOTE — Telephone Encounter (Signed)
Patient's wife states while admitted Dr. Margaretann Loveless was concerned with the patient returning to work in his condition and discussed restrictions, advising that documentation can be provided if necessary. She is requesting this documentation.

## 2022-05-31 NOTE — Telephone Encounter (Signed)
Spoke to patient- work note provided via [place] at the request of Dr. Margaretann Loveless. Patient to do light duty and ground level work until cardiac work up is completed.   Scheduled patient to see Dr. Margaretann Loveless on 3/14 at 3:40- patient aware of appointment time and date.   Advised patient to call back to office with any issues, questions, or concerns. Patient verbalized understanding.

## 2022-06-01 DIAGNOSIS — R55 Syncope and collapse: Secondary | ICD-10-CM | POA: Diagnosis not present

## 2022-06-07 ENCOUNTER — Ambulatory Visit: Payer: 59 | Admitting: General Practice

## 2022-06-07 ENCOUNTER — Telehealth: Payer: Self-pay

## 2022-06-07 DIAGNOSIS — I422 Other hypertrophic cardiomyopathy: Secondary | ICD-10-CM

## 2022-06-07 DIAGNOSIS — R55 Syncope and collapse: Secondary | ICD-10-CM

## 2022-06-07 NOTE — Telephone Encounter (Signed)
-----   Message from Isaiah Serge, NP sent at 06/07/2022  4:38 PM EST ----- Can you please put in order for plain old exercise stress test?  I tried but it did not work.  He was supposed to have it this week.  If possible for syncope and hypertropic cardiomyopathy.  I will be glad to cosign or so Will Dr. Margaretann Loveless thanks.

## 2022-06-07 NOTE — Telephone Encounter (Signed)
Left detailed message on answering machine about ETT scheduled on 06/09/22 at 8:15.

## 2022-06-07 NOTE — Telephone Encounter (Signed)
GXT Order placed.

## 2022-06-08 ENCOUNTER — Telehealth: Payer: Self-pay | Admitting: Student

## 2022-06-08 ENCOUNTER — Other Ambulatory Visit: Payer: Self-pay | Admitting: Cardiology

## 2022-06-08 ENCOUNTER — Encounter (HOSPITAL_BASED_OUTPATIENT_CLINIC_OR_DEPARTMENT_OTHER): Payer: Self-pay | Admitting: Family Medicine

## 2022-06-08 ENCOUNTER — Ambulatory Visit (HOSPITAL_BASED_OUTPATIENT_CLINIC_OR_DEPARTMENT_OTHER): Payer: 59 | Admitting: Family Medicine

## 2022-06-08 VITALS — BP 115/77 | HR 59 | Ht 68.0 in | Wt 148.1 lb

## 2022-06-08 DIAGNOSIS — L03039 Cellulitis of unspecified toe: Secondary | ICD-10-CM

## 2022-06-08 DIAGNOSIS — R55 Syncope and collapse: Secondary | ICD-10-CM

## 2022-06-08 NOTE — Assessment & Plan Note (Signed)
Reports he was having severe pain with his left fifth toe infection, which caused him to have a syncopal episode.  EMS was called, he was evaluated at the emergency department.  He is now under the care of cardiology wearing a continuous monitor.  He presents today to follow-up for a podiatry referral.  He is currently on doxycycline twice daily for 10 days for left fifth toe infection.  Left fifth toenail discolored.  No purulence noted.  Ambulatory referral for podiatry placed today for further evaluation and treatment.

## 2022-06-08 NOTE — Progress Notes (Signed)
Established Patient Office Visit  Subjective   Patient ID: Tony Merritt, male    DOB: January 24, 1976  Age: 47 y.o. MRN: [id number]  Chief Complaint  Patient presents with   Follow-up    Pt here for f/u from er, pt did stated he is wearing a heart monitor and he is being monitored by cardiology     HPI Follow-up from ED visit on 05/30/22. Wearing continuous heart monitor, Has stress test scheduled 06/09/22. Follow-up cardiology scheduled on 07/06/22. MRI scheduled for 08/04/22. Cardiology is concerned for hypertropic cardiomyopathy.   Presents today to have toe assessed and referral placed.  Has left 5th toe infection, on doxycycline 100 mg BID for 10 days. Had syncopal episode related to pain in left 5th toe and wife called EMS and he was evaluated in ED where his heart issue was discovered.   Needs podiatry referral today for further evaluation of left 5th toe.    Review of Systems  Constitutional:  Negative for chills and fever.  Respiratory:  Negative for shortness of breath.   Cardiovascular:  Negative for chest pain.  Gastrointestinal:  Negative for abdominal pain, nausea and vomiting.  Musculoskeletal:        Left 5th toe infection.       Objective:     BP 115/77 (BP Location: Right Arm, Patient Position: Sitting, Cuff Size: Large)   Pulse (!) 59   Ht 5' 8"$  (1.727 m)   Wt 148 lb 1.6 oz (67.2 kg)   SpO2 100%   BMI 22.52 kg/m  BP Readings from Last 3 Encounters:  06/08/22 115/77  05/30/22 114/82  05/23/21 112/70      Physical Exam Vitals and nursing note reviewed.  Constitutional:      General: He is not in acute distress.    Appearance: Normal appearance. He is normal weight.  Cardiovascular:     Heart sounds: Normal heart sounds. No murmur heard. Pulmonary:     Effort: Pulmonary effort is normal.     Breath sounds: Normal breath sounds.  Musculoskeletal:     Comments: Left 5th nail with dark color. Has not grown in years.   Skin:    General: Skin is  warm and dry.     Capillary Refill: Capillary refill takes less than 2 seconds.  Neurological:     General: No focal deficit present.     Mental Status: He is alert. Mental status is at baseline.  Psychiatric:        Mood and Affect: Mood normal.        Behavior: Behavior normal.        Thought Content: Thought content normal.        Judgment: Judgment normal.     No results found for any visits on 06/08/22.    The 10-year ASCVD risk score (Arnett DK, et al., 2019) is: 1.7%    Assessment & Plan:   Problem List Items Addressed This Visit     Cellulitis of fifth toe - Primary    Reports he was having severe pain with his left fifth toe infection, which caused him to have a syncopal episode.  EMS was called, he was evaluated at the emergency department.  He is now under the care of cardiology wearing a continuous monitor.  He presents today to follow-up for a podiatry referral.  He is currently on doxycycline twice daily for 10 days for left fifth toe infection.  Left fifth toenail discolored.  No purulence noted.  Ambulatory referral for podiatry placed today for further evaluation and treatment.       Relevant Orders   Ambulatory referral to Podiatry  Agrees with plan of care discussed.  Questions answered.   Return in about 1 month (around 07/07/2022) for cpe .    Chalmers Guest, FNP

## 2022-06-08 NOTE — Telephone Encounter (Signed)
  Received page from [facility] about a critical EKG. Called and spoke with rep. Patient had 2 back to back pauses of 3.4 seconds and then 3.1 seconds earlier this morning at 5:23am EST. Baseline rhythm sinus bradycardia with rate of 46 bpm. This was an automatic trigger. He is a very active gentleman and has a low resting heart rate at baseline. Patient is wearing a monitor for further evaluation of syncope. Called and spoke with patient. He confirmed that he was sleeping at this time. He denies any symptoms. He is not on any AV nodal agents. Explained that nocturnal pauses are not uncommon to see. No intervention needed at this time. Will continue to monitor and wait for full monitor results. However, advised patient to let us know if he has any recurrent syncope or severe dizziness/ lightheadedness.  Darreld Mclean, PA-C 06/08/2022 8:02 AM

## 2022-06-09 ENCOUNTER — Ambulatory Visit (HOSPITAL_COMMUNITY): Payer: 59 | Attending: Cardiology

## 2022-06-09 DIAGNOSIS — R55 Syncope and collapse: Secondary | ICD-10-CM | POA: Insufficient documentation

## 2022-06-09 DIAGNOSIS — I422 Other hypertrophic cardiomyopathy: Secondary | ICD-10-CM | POA: Diagnosis not present

## 2022-06-09 LAB — EXERCISE TOLERANCE TEST
Angina Index: 0
Peak HR: 171 {beats}/min
Rest HR: 54 {beats}/min
ST Depression (mm): 2 mm

## 2022-06-13 ENCOUNTER — Ambulatory Visit: Payer: 59 | Admitting: Podiatry

## 2022-06-20 ENCOUNTER — Ambulatory Visit: Payer: 59 | Admitting: Podiatry

## 2022-06-20 DIAGNOSIS — Q828 Other specified congenital malformations of skin: Secondary | ICD-10-CM

## 2022-06-20 NOTE — Progress Notes (Signed)
  Subjective:  Patient ID: Tony Merritt, male    DOB: Dec 17, 1975,  MRN: [id number]  Chief Complaint  Patient presents with   Nail Problem    47 y.o. male presents with the above complaint.    Review of Systems: Negative except as noted in the HPI. Denies N/V/F/Ch.  No past medical history on file.  Current Outpatient Medications:    Cholecalciferol (VITAMIN D-3 PO), Take 1 capsule by mouth daily. (Patient not taking: Reported on 06/08/2022), Disp: , Rfl:    doxycycline (VIBRAMYCIN) 100 MG capsule, Take 1 capsule (100 mg total) by mouth 2 (two) times daily., Disp: 20 capsule, Rfl: 0  Social History   Tobacco Use  Smoking Status Never  Smokeless Tobacco Never    Allergies  Allergen Reactions   Shellfish Allergy Anaphylaxis and Swelling    Swelling of mouth, tongue, throat   Shrimp (Diagnostic) Anaphylaxis and Swelling    Swelling of mouth, tongue, throat   Objective:  There were no vitals filed for this visit. There is no height or weight on file to calculate BMI. Constitutional Well developed. Well nourished.  Vascular Dorsalis pedis pulses palpable bilaterally. Posterior tibial pulses palpable bilaterally. Capillary refill normal to all digits.  No cyanosis or clubbing noted. Pedal hair growth normal.  Neurologic Normal speech. Oriented to person, place, and time. Epicritic sensation to light touch grossly present bilaterally.  Dermatologic Pain on palpation to the left fifth digit with underlying porokeratotic lesion with central nucleated core.  Pain on palpation to the lesion.  Hammertoe contracture noted semiflexible in nature to the fifth digit  Orthopedic: Normal joint ROM without pain or crepitus bilaterally. No visible deformities. No bony tenderness.   Radiographs: None Assessment:   1. Porokeratosis    Plan:  Patient was evaluated and treated and all questions answered.  Left fifth digit porokeratosis with underlying hammertoe  contracture -All questions and concerns were discussed with the patient in extensive detail.  Using chisel blade and a handle as a courtesy the lesion was debrided down to healthy striated tissue.  If there is no relief we will discuss surgical options. -Also discussed shoe gear modification as well.  No follow-ups on file.

## 2022-06-22 ENCOUNTER — Telehealth (HOSPITAL_COMMUNITY): Payer: Self-pay | Admitting: Emergency Medicine

## 2022-06-22 NOTE — Telephone Encounter (Signed)
Reaching out to patient to offer assistance regarding upcoming cardiac imaging study; pt verbalizes understanding of appt date/time, parking situation and where to check in, pre-test NPO status and medications ordered, and verified current allergies; name and call back number provided for further questions should they arise Tony Bond RN Navigator Cardiac Imaging [facility] [phone] office [phone] cell  Arrival [address] Denies metal implants Denies claustro  Denies iv issues

## 2022-06-23 ENCOUNTER — Other Ambulatory Visit: Payer: Self-pay | Admitting: Cardiology

## 2022-06-23 ENCOUNTER — Ambulatory Visit (HOSPITAL_COMMUNITY)
Admission: RE | Admit: 2022-06-23 | Discharge: 2022-06-23 | Disposition: A | Discharge status: Home / Self Care | Payer: 59 | Source: Ambulatory Visit | Attending: Cardiology | Admitting: Cardiology

## 2022-06-23 DIAGNOSIS — R55 Syncope and collapse: Secondary | ICD-10-CM

## 2022-06-23 DIAGNOSIS — I422 Other hypertrophic cardiomyopathy: Secondary | ICD-10-CM | POA: Insufficient documentation

## 2022-06-23 MED ORDER — GADOBUTROL 1 MMOL/ML IV SOLN
9.0000 mL | Freq: Once | INTRAVENOUS | Status: AC | PRN
  Administered 2022-06-23: 9 mL via INTRAVENOUS

## 2022-07-05 ENCOUNTER — Encounter (HOSPITAL_BASED_OUTPATIENT_CLINIC_OR_DEPARTMENT_OTHER): Payer: Self-pay

## 2022-07-06 ENCOUNTER — Ambulatory Visit: Payer: 59 | Attending: Internal Medicine | Admitting: Internal Medicine

## 2022-07-06 ENCOUNTER — Encounter: Payer: Self-pay | Admitting: Internal Medicine

## 2022-07-06 VITALS — BP 126/76 | HR 84 | Ht 68.0 in | Wt 145.0 lb

## 2022-07-06 DIAGNOSIS — I422 Other hypertrophic cardiomyopathy: Secondary | ICD-10-CM | POA: Diagnosis not present

## 2022-07-06 DIAGNOSIS — R55 Syncope and collapse: Secondary | ICD-10-CM | POA: Diagnosis not present

## 2022-07-06 NOTE — Patient Instructions (Signed)
Medication Instructions:  No Changes In Medications at this time.  *If you need a refill on your cardiac medications before your next appointment, please call your pharmacy*  Follow-Up: At [facility], you and your health needs are our priority.  As part of our continuing mission to provide you with exceptional heart care, we have created designated Provider Care Teams.  These Care Teams include your primary Cardiologist (physician) and Advanced Practice Providers (APPs -  Physician Assistants and Nurse Practitioners) who all work together to provide you with the care you need, when you need it.  Your next appointment:   3 month(s)  Provider:   Dr. Margaretann Loveless

## 2022-07-06 NOTE — Progress Notes (Signed)
Cardiology Office Note:    Date:  07/06/2022  ID:  Tony Merritt, DOB 1975/10/16, MRN [id number]  PCP:  de [place], Raymond J, MD  Cardiologist:  None  Electrophysiologist:  None   Referring MD: de [place], Raymond J, MD   Chief Complaint/Reason for Referral: F/u syncope and Apical HCM   History of Present Illness:    Tony Merritt is a 47 y.o. male with a history of syncope who presents for follow up of apical HCM and syncope immediately post peak exercise.  He is feeling well overall after our initial encounter in the emergency department.  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  We pursued MRI, treadmill test, and cardiac monitor to screen in the setting of syncope.  Cardiac MRI showed an indexed wall thickness consistent with probable apical hypertrophic cardiomyopathy, see MRI report for details.  We discussed in detail that his wall thickness is not classically thickened by MRI, but based on apical to basal ratio and index wall thickness, this is the most likely diagnosis.  There is minimal delayed enhancement on MRI, certainly less than 0000000 of the myocardial mass, seen in the mid ventricular inferior RV insertion point, and faint mid myocardial LGE in the lateral apex and apical segment.  Overall low risk pattern of LGE.  Suspect that the RV insertion point LGE is more related to his endurance athleticism with distance bike rides.  There is also borderline right ventricular enlargement by indexed volume and this may also represent that.  Exercise treadmill test was overall low risk.  Study was nondiagnostic for ischemia due to baseline EKG, despite interpretation.  Patient had excellent exercise capacity.  He did have dizziness immediately post stress but did not experience syncope.  No arrhythmia at peak stress.  Cardiac monitor demonstrated greater than 3 seconds pauses, overnight while patient was asleep.  Patient had no significant  symptoms during cardiac monitor (2 weeks) but also scaled back significantly on his activity given my recommendations for not pursuing extreme exercise until workup complete.  No episodes of syncope during monitor.  Overall these findings suggest that with a diagnosis of apical hypertrophic cardiomyopathy this is likely a low risk phenotype and his syncope is unlikely to be due to malignant ventricular arrhythmia or obstructive ventricular phenomenon.  We do not yet have an etiology for his syncope.  He did have an elevated blood pressure with peak exercise and I suspect that this falls quite rapidly when he stops exercising and this sudden drop in blood pressure is likely the cause of his post peak exertion syncope and presyncope.  We discussed in detail that given findings on cardiac monitor, we will plan for EP evaluation.  The patient does snore approximately once or twice a week, no endorsed apneic episodes.  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  He works for the city of [place].  He does on occasion need to climb ladders and be at an elevated height for his work.  He is doing light duty and groundwork at this time.  Until EP evaluation is complete, I have requested that he continue to do groundwork only.  This is because I remain concerned about the etiology of his syncope and remain concerned that if he were on a ladder and experienced syncope, this could be life-threatening.  He and his wife who was present for the visit today understand and are in agreement with continued restrictions.  His toe  inflammation has healed well and was attributed to ingrown toenail.   No past medical history on file.  Past Surgical History:  Procedure Laterality Date   KNEE SURGERY     SHOULDER ARTHROSCOPY      Current Medications: Current Meds  Medication Sig   Cholecalciferol (VITAMIN D-3 PO) Take 1 capsule by mouth daily.     Allergies:   Shellfish allergy  and Shrimp (diagnostic)   Social History   Tobacco Use   Smoking status: Never   Smokeless tobacco: Never  Vaping Use   Vaping Use: Never used  Substance Use Topics   Alcohol use: Not Currently   Drug use: Not Currently     Family History: The patient's family history includes Cancer in his maternal grandmother and maternal uncle; Healthy in his father and mother; Heart attack in his maternal grandfather, paternal grandfather, and paternal grandmother.  ROS:   Please see the history of present illness.   All other systems reviewed and are negative.  EKGs/Labs/Other Studies Reviewed:    The following studies were reviewed today:  ECHO 05/30/2022:   IMPRESSIONS     1. Left ventricular ejection fraction, by estimation, is 60 to 65%. The  left ventricle has normal function. The left ventricle has no regional  wall motion abnormalities. There is mild asymmetric left ventricular  hypertrophy of the apical segment. Left  ventricular diastolic parameters were normal.   2. Right ventricular systolic function is normal. The right ventricular  size is normal.   3. The mitral valve is normal in structure. No evidence of mitral valve  regurgitation. No evidence of mitral stenosis.   4. The aortic valve is normal in structure. Aortic valve regurgitation is  not visualized. No aortic stenosis is present.   5. The inferior vena cava is normal in size with greater than 50%  respiratory variability, suggesting right atrial pressure of 3 mmHg.   Conclusion(s)/Recommendation(s): Findings consistent with hypertrophic cardiomyopathy _ apical variant. The degree of hypertrophy is mild and there is no apical aneurysm formation/entrapment yet. May be early in the development of the full phenotypic  changes. Suggest correlation with cardiac MRI.   EKG:  The EKG is personally reviewed 07/06/2022: The EKG was not ordered today.  Imaging studies that I have independently reviewed today: cardiac  MRI, ETT.   Recent Labs: 05/30/2022: BUN 27; Creatinine, Ser 1.24; Hemoglobin 15.4; Platelets 155; Potassium 4.5; Sodium 140  Recent Lipid Panel    Component Value Date/Time   CHOL 266 (H) 08/11/2020 1631   TRIG 131 08/11/2020 1631   HDL 72 08/11/2020 1631   CHOLHDL 3.7 08/11/2020 1631   VLDL 26 08/11/2020 1631   LDLCALC 168 (H) 08/11/2020 1631    Physical Exam:    VS:  BP 126/76   Pulse 84   Ht 5\' 8"  (1.727 m)   Wt 145 lb (65.8 kg)   SpO2 96%   BMI 22.05 kg/m     Wt Readings from Last 5 Encounters:  07/06/22 145 lb (65.8 kg)  06/08/22 148 lb 1.6 oz (67.2 kg)  05/23/21 148 lb (67.1 kg)  03/21/21 146 lb (66.2 kg)  01/17/21 145 lb 6.4 oz (66 kg)    Constitutional: No acute distress Eyes: sclera non-icteric, normal conjunctiva and lids ENMT: normal dentition, moist mucous membranes Cardiovascular: regular rhythm, normal rate, no murmur. S1 and S2 normal. No jugular venous distention.  Respiratory: clear to auscultation bilaterally GI : normal bowel sounds, soft and nontender. No distention.  MSK: extremities warm, well perfused. No edema.  NEURO: grossly nonfocal exam, moves all extremities. PSYCH: alert and oriented x 3, normal mood and affect.   ASSESSMENT:    1. Syncope and collapse   2. Apical variant hypertrophic cardiomyopathy (HCC)    PLAN:    Syncope and collapse  Apical hypertrophic cardiomyopathy ([place]) - Plan: Ambulatory referral to Cardiac Electrophysiology  We reviewed his testing in detail, all of which seems most consistent with apical hypertrophic cardiomyopathy.  Low risk cardiac monitor, exercise treadmill test, and cardiac MRI.  We do not yet have a proven etiology for his syncope, though I suspect it is related to precipitous drop in blood pressure after peak exercise.  This does not appear to be obstructive in nature.  In addition he has some evidence of pauses on cardiac monitor.  Given his desire to continue to participate in endurance athletics,  as well as his work as an Clinical biochemist requiring working at [employer], I would like for him to visit with electrophysiology to fully exclude an arrhythmogenic source of syncope.  I have asked him to wear compression socks with activity and he may participate in moderate physical activity as long as he remains asymptomatic.  He will need to continue ground-level work as an Clinical biochemist until his electrophysiology appointment is complete.  All questions answered to the best of my ability, educational materials on apical HCM provided to the patient at our last visit and they have had an opportunity to review.  Total time of encounter: 40 minutes total time of encounter, including 30 minutes spent in face-to-face patient care on the date of this encounter. This time includes coordination of care and counseling regarding above mentioned problem list. Remainder of non-face-to-face time involved reviewing chart documents/testing relevant to the patient encounter and documentation in the medical record. I have independently reviewed documentation from referring provider.   Cherlynn Kaiser, MD, [place]   Shared Decision Making/Informed Consent:       Medication Adjustments/Labs and Tests Ordered: Current medicines are reviewed at length with the patient today.  Concerns regarding medicines are outlined above.   Orders Placed This Encounter  Procedures   Ambulatory referral to Cardiac Electrophysiology    No orders of the defined types were placed in this encounter.   Patient Instructions  Medication Instructions:  No Changes In Medications at this time.  *If you need a refill on your cardiac medications before your next appointment, please call your pharmacy*  Follow-Up: At [facility], you and your health needs are our priority.  As part of our continuing mission to provide you with exceptional heart care, we have created designated Provider Care Teams.  These  Care Teams include your primary Cardiologist (physician) and Advanced Practice Providers (APPs -  Physician Assistants and Nurse Practitioners) who all work together to provide you with the care you need, when you need it.  Your next appointment:   3 month(s)  Provider:   Dr. Margaretann Loveless    Scribe note heavily edited by physician, and was not used for documentation purposes.

## 2022-07-11 ENCOUNTER — Ambulatory Visit (INDEPENDENT_AMBULATORY_CARE_PROVIDER_SITE_OTHER): Payer: 59 | Admitting: Family Medicine

## 2022-07-11 ENCOUNTER — Encounter (HOSPITAL_BASED_OUTPATIENT_CLINIC_OR_DEPARTMENT_OTHER): Payer: Self-pay | Admitting: Family Medicine

## 2022-07-11 VITALS — BP 119/79 | HR 81 | Ht 68.0 in | Wt 145.0 lb

## 2022-07-11 DIAGNOSIS — Z1211 Encounter for screening for malignant neoplasm of colon: Secondary | ICD-10-CM | POA: Diagnosis not present

## 2022-07-11 DIAGNOSIS — Z0000 Encounter for general adult medical examination without abnormal findings: Secondary | ICD-10-CM | POA: Diagnosis not present

## 2022-07-11 NOTE — Assessment & Plan Note (Addendum)
Routine HCM labs ordered. HCM reviewed/discussed. Anticipatory guidance regarding healthy weight, lifestyle and choices given. Recommend healthy diet.  Recommend approximately 150 minutes/week of moderate intensity exercise Recommend regular dental and vision exams Always use seatbelt/lap and shoulder restraints Recommend using smoke alarms and checking batteries at least twice a year Recommend using sunscreen when outside Discussed colon cancer screening recommendations, options.  Patient would like to proceed with Cologuard Discussed tetanus immunization recommendations, patient is UTD The natural history of prostate cancer and ongoing controversy regarding screening and potential treatment outcomes of prostate cancer has been discussed with the patient. The meaning of a false positive PSA and a false negative PSA has been discussed. He indicates understanding of the limitations of this screening test and wishes not to proceed with screening PSA testing.

## 2022-07-11 NOTE — Progress Notes (Signed)
Subjective:    CC: Annual Physical Exam  HPI:  Tony Merritt is a 47 y.o. presenting for annual physical  I reviewed the past medical history, family history, social history, surgical history, and allergies today and no changes were needed.  Please see the problem list section below in epic for further details.  Past Medical History: History reviewed. No pertinent past medical history. Past Surgical History: Past Surgical History:  Procedure Laterality Date   KNEE SURGERY     SHOULDER ARTHROSCOPY     Social History: Social History   Socioeconomic History   Marital status: Married    Spouse name: Not on file   Number of children: Not on file   Years of education: Not on file   Highest education level: Not on file  Occupational History   Not on file  Tobacco Use   Smoking status: Never   Smokeless tobacco: Never  Vaping Use   Vaping Use: Never used  Substance and Sexual Activity   Alcohol use: Not Currently   Drug use: Not Currently   Sexual activity: Yes    Birth control/protection: None  Other Topics Concern   Not on file  Social History Narrative   Not on file   Social Determinants of Health   Financial Resource Strain: Not on file  Food Insecurity: Not on file  Transportation Needs: Not on file  Physical Activity: Not on file  Stress: Not on file  Social Connections: Not on file   Family History: Family History  Problem Relation Age of Onset   Healthy Mother    Healthy Father    Cancer Maternal Uncle        deceased at 61 - unknown type of cancer   Cancer Maternal Grandmother        deceased at age 61 - cancer type unknown   Heart attack Maternal Grandfather        deceased at age 48   Heart attack Paternal Grandmother        deceased at age 62   Heart attack Paternal Grandfather        deceased at age 62   Allergies: Allergies  Allergen Reactions   Shellfish Allergy Anaphylaxis and Swelling    Swelling of mouth, tongue, throat    Shrimp (Diagnostic) Anaphylaxis and Swelling    Swelling of mouth, tongue, throat   Medications: See med rec.  Review of Systems: No headache, visual changes, nausea, vomiting, diarrhea, constipation, dizziness, abdominal pain, skin rash, fevers, chills, night sweats, swollen lymph nodes, weight loss, chest pain, body aches, joint swelling, muscle aches, shortness of breath, mood changes, visual or auditory hallucinations.  Objective:    BP 119/79 (BP Location: Right Arm, Patient Position: Sitting, Cuff Size: Large)   Pulse 81   Ht 5\' 8"  (1.727 m)   Wt 145 lb (65.8 kg)   SpO2 100%   BMI 22.05 kg/m   General: Well Developed, well nourished, and in no acute distress. Neuro: Alert and oriented x3, extra-ocular muscles intact, sensation grossly intact. Cranial nerves II through XII are intact, motor, sensory, and coordinative functions are all intact. HEENT: Normocephalic, atraumatic, pupils equal round reactive to light, neck supple, no masses, no lymphadenopathy, thyroid nonpalpable. Oropharynx, nasopharynx, external ear canals are unremarkable. Skin: Warm and dry, no rashes noted. Cardiac: Regular rate and rhythm, no murmurs rubs or gallops. Respiratory: Clear to auscultation bilaterally. Not using accessory muscles, speaking in full sentences. Abdominal: Soft, nontender, nondistended, positive bowel sounds, no masses,  no organomegaly. Musculoskeletal: Shoulder, elbow, wrist, hip, knee, ankle stable, and with full range of motion.  Impression and Recommendations:    Wellness examination Routine HCM labs ordered. HCM reviewed/discussed. Anticipatory guidance regarding healthy weight, lifestyle and choices given. Recommend healthy diet.  Recommend approximately 150 minutes/week of moderate intensity exercise Recommend regular dental and vision exams Always use seatbelt/lap and shoulder restraints Recommend using smoke alarms and checking batteries at least twice a year Recommend using  sunscreen when outside Discussed colon cancer screening recommendations, options.  Patient would like to proceed with Cologuard Discussed tetanus immunization recommendations, patient is UTD The natural history of prostate cancer and ongoing controversy regarding screening and potential treatment outcomes of prostate cancer has been discussed with the patient. The meaning of a false positive PSA and a false negative PSA has been discussed. He indicates understanding of the limitations of this screening test and wishes not to proceed with screening PSA testing.  Return in about 1 year (around 07/11/2023) for CPE.   ___________________________________________ Bintou Lafata de [place], MD, ABFM, [facility] [place]

## 2022-07-29 LAB — COLOGUARD: COLOGUARD: NEGATIVE

## 2022-08-04 ENCOUNTER — Other Ambulatory Visit (HOSPITAL_COMMUNITY): Payer: 59

## 2022-08-09 ENCOUNTER — Ambulatory Visit: Payer: 59 | Attending: Cardiology | Admitting: Cardiology

## 2022-08-09 ENCOUNTER — Encounter: Payer: Self-pay | Admitting: Cardiology

## 2022-08-09 VITALS — BP 124/76 | HR 61 | Ht 68.0 in | Wt 147.6 lb

## 2022-08-09 DIAGNOSIS — I422 Other hypertrophic cardiomyopathy: Secondary | ICD-10-CM

## 2022-08-09 DIAGNOSIS — R55 Syncope and collapse: Secondary | ICD-10-CM | POA: Diagnosis not present

## 2022-08-09 NOTE — Patient Instructions (Addendum)
Medication Instructions:  Your physician recommends that you continue on your current medications as directed. Please refer to the Current Medication list given to you today.  Labwork: None ordered.  Testing/Procedures: None ordered.  Follow-Up:  Please schedule a 6 month follow up appointment with Mr. Lanna Poche EP APP.   Per Dr. Lalla Brothers  Your physician wants you to follow-up in: 6 Months with Mr. Lanna Poche EP APP.  You will receive a reminder letter in the mail two months in advance. If you don't receive a letter, please call our office to schedule the follow-up appointment.    Implantable Loop Recorder Placement, Care After This sheet gives you information about how to care for yourself after your procedure. Your health care provider may also give you more specific instructions. If you have problems or questions, contact your health care provider. What can I expect after the procedure? After the procedure, it is common to have: Soreness or discomfort near the incision. Some swelling or bruising near the incision.  Follow these instructions at home: Incision care  Monitor your cardiac device site for redness, swelling, and drainage. Call the device clinic at [phone] if you experience these symptoms or fever/chills.  Keep the large square bandage on your site for 24 hours and then you may remove it yourself. Keep the steri-strips underneath in place.   You may shower after 72 hours / 3 days from your procedure with the steri-strips in place. They will usually fall off on their own, or may be removed after 10 days. Pat dry.   Avoid lotions, ointments, or perfumes over your incision until it is well-healed.  Please do not submerge in water until your site is completely healed.   Your device is MRI compatible.   Remote monitoring is used to monitor your cardiac device from home. This monitoring is scheduled every month by our office. It allows [place] to keep an eye on the function of  your device to ensure it is working properly.  If your wound site starts to bleed apply pressure.    For help with the monitor please call [employer] Monitor Support Specialist directly at [phone].    If you have any questions/concerns please call the device clinic at [phone].  Activity  Return to your normal activities.  General instructions Follow instructions from your health care provider about how to manage your implantable loop recorder and transmit the information. Learn how to activate a recording if this is necessary for your type of device. You may go through a metal detection gate, and you may let someone hold a metal detector over your chest. Show your ID card if needed. Do not have an MRI unless you check with your health care provider first. Take over-the-counter and prescription medicines only as told by your health care provider. Keep all follow-up visits as told by your health care provider. This is important. Contact a health care provider if: You have redness, swelling, or pain around your incision. You have a fever. You have pain that is not relieved by your pain medicine. You have triggered your device because of fainting (syncope) or because of a heartbeat that feels like it is racing, slow, fluttering, or skipping (palpitations). Get help right away if you have: Chest pain. Difficulty breathing. Summary After the procedure, it is common to have soreness or discomfort near the incision. Change your dressing as told by your health care provider. Follow instructions from your health care provider about how to manage your implantable loop  recorder and transmit the information. Keep all follow-up visits as told by your health care provider. This is important. This information is not intended to replace advice given to you by your health care provider. Make sure you discuss any questions you have with your health care provider. Document Released: 03/22/2015  Document Revised: 05/26/2017 Document Reviewed: 05/26/2017 Elsevier Patient Education  2020 [employer].

## 2022-08-09 NOTE — Progress Notes (Signed)
Electrophysiology Office Note:    Date:  08/09/2022   ID:  Tony Merritt, DOB 06/07/1975, MRN [id number]  PCP:  de [place], Raymond J, MD  [facility] Cardiologist:  None  [facility] Electrophysiologist:  Lanier Prude, MD   Referring MD: Parke Poisson, MD   Chief Complaint: Syncope  History of Present Illness:    Tony Merritt is a 47 y.o. male who presents for an evaluation of syncope at the request of Dr. Jacques Navy.  Seen by Dr. Jacques Navy 07/06/2022 in follow-up of his presentation to the ED 05/30/22 for syncope without prodrome, thought to be vasovagal. His EKG and echocardiogram were strongly suggestive of apical hypertrophic cardiomyopathy. Cardiac MRI showed an indexed wall thickness consistent with probable apical hypertrophic cardiomyopathy. ETT was low risk; he had dizziness post stress but did not lose consciousness.   Cardiac monitor demonstrated greater than 3 seconds pauses, overnight while patient was asleep. Patient had no significant symptoms during cardiac monitor (2 weeks) but also scaled back significantly on his activity given Dr. Lupe Carney recommendations for not pursuing extreme exercise until workup complete. No episodes of syncope during monitor.   Etiology for his syncope remained unknown and he was referred to EP for evaluation.  Today, he states that on the day of his syncopal episode, he had woken up due to throbbing pain in his toe. He got out of bed and passed out. EMS was called. This was his most recent episode. He endorses prior episodes of dizziness but not with LOC.  The episodes come on without clear triggers.  At times, while exercising he will feel presyncopal.  This symptoms will progress to the point where he is forced to stop.  He says that these episodes typically last a maximum of 30 seconds.    He routinely runs for exercise. This past November he participated in a marathon. He was training for another when he was advised to  stop his exercise. He does monitor his heart rate while running.    He denies any palpitations, chest pain, shortness of breath, or peripheral edema. No headaches, orthopnea, or PND.  Past medical, social, surgical histories reviewed.      ROS:   Please see the history of present illness.    (+) Dizziness All other systems reviewed and are negative.  EKGs/Labs/Other Studies Reviewed:    The following studies were reviewed today:  Monitor 06/2022: Impressions: No ventricular arrhythmia. Pauses with sinus bradycardia, pauses appear nocturnal. No syncope documented during monitor duration.     EKG:   EKG is personally reviewed.  08/09/2022:  EKG was not ordered.   Recent Labs: 05/30/2022: BUN 27; Creatinine, Ser 1.24; Hemoglobin 15.4; Platelets 155; Potassium 4.5; Sodium 140   Recent Lipid Panel    Component Value Date/Time   CHOL 266 (H) 08/11/2020 1631   TRIG 131 08/11/2020 1631   HDL 72 08/11/2020 1631   CHOLHDL 3.7 08/11/2020 1631   VLDL 26 08/11/2020 1631   LDLCALC 168 (H) 08/11/2020 1631    Physical Exam:    VS:  BP 124/76   Pulse 61   Ht  (1.727 m)   Wt 147 lb 9.6 oz (67 kg)   SpO2 95%   BMI 22.44 kg/m     Wt Readings from Last 3 Encounters:  08/09/22 147 lb 9.6 oz (67 kg)  07/11/22 145 lb (65.8 kg)  07/06/22 145 lb (65.8 kg)     GEN: Well nourished, well developed in no acute  distress CARDIAC: RRR, no murmurs, rubs, gallops PSYCHIATRIC:  Normal affect       ASSESSMENT:    1. Syncope and collapse   2. Apical variant hypertrophic cardiomyopathy    PLAN:    In order of problems listed above:  #Syncope Concerning for arrhythmic syncope.  High risk.  Infrequent episodes.  Unfortunately we were not able to capture an episode on the external monitor.  Given the infrequent nature of the syncopal/near syncopal episodes in a high risk patient (given a diagnosis of apical variant hypertrophic cardiomyopathy), another external monitor is not  appropriate and I have recommended proceeding to an implantable loop recorder. He would like to proceed.  #Apical variant hypertrophic cardiomyopathy Not a significant mount of LGE on recent cardiac MRI.  I am concerned given his syncopal/near syncopal episodes that he is at risk for more dangerous abnormal heart rhythms.  I recommended loop recorder monitoring as above.  I think it is reasonable for him to proceed with exercise.  I have asked him not to max out his heart rates but to stay in an aerobic zone.  We will be able to monitor his heart rates using a loop recorder to confirm no exercise-induced arrhythmias.  Follow-up 6 months with APP.  Medication Adjustments/Labs and Tests Ordered: Current medicines are reviewed at length with the patient today.  Concerns regarding medicines are outlined above.   No orders of the defined types were placed in this encounter.  No orders of the defined types were placed in this encounter.   I,Mathew Stumpf,acting as a Neurosurgeon for Lanier Prude, MD.,have documented all relevant documentation on the behalf of Lanier Prude, MD,as directed by  Lanier Prude, MD while in the presence of Lanier Prude, MD.  I, Lanier Prude, MD, have reviewed all documentation for this visit. The documentation on 08/09/22 for the exam, diagnosis, procedures, and orders are all accurate and complete.  Signed, Rossie Muskrat. Lalla Brothers, MD, [facility], [facility] 08/09/2022 2:25 PM    Electrophysiology [facility]

## 2022-08-09 NOTE — Progress Notes (Addendum)
SURGEON:  Graciella Freer, PA-C     PREPROCEDURE DIAGNOSIS:  Syncope    POSTPROCEDURE DIAGNOSIS: Syncope     PROCEDURES:   1. Implantable loop recorder implantation    INTRODUCTION:  Tony Merritt presents with a history of syncope The costs of loop recorder monitoring have been discussed with the patient.    DESCRIPTION OF PROCEDURE:  Informed written consent was obtained.  The patient required no sedation for the procedure today.  Mapping over the patient's chest was performed to identify the area where electrograms were most prominent for ILR recording.  This area was found to be the left parasternal region over the 4th intercostal space. The patients left chest was therefore prepped and draped in the usual sterile fashion. The skin overlying the left parasternal region was infiltrated with lidocaine for local analgesia.  A 0.5-cm incision was made over the left parasternal region over the 3rd intercostal space.  A subcutaneous ILR pocket was fashioned using a combination of sharp and blunt dissection.  A [employer] Reveal [id number]) implantable loop recorder was then placed into the pocket  R waves were very prominent and measured >0.20mV.  Steri- Strips and a sterile dressing were then applied.  There were no early apparent complications.     CONCLUSIONS:   1. Successful implantation of a implantable loop recorder for Syncope  2. No early apparent complications.   Dr. Lalla Brothers was present during the entirety of the case for supervision and proctoring.   Casimiro Needle "Otilio Saber, [place]  Cardiac Electrophysiology

## 2022-09-12 ENCOUNTER — Ambulatory Visit (INDEPENDENT_AMBULATORY_CARE_PROVIDER_SITE_OTHER): Payer: 59

## 2022-09-12 DIAGNOSIS — R55 Syncope and collapse: Secondary | ICD-10-CM | POA: Diagnosis not present

## 2022-09-13 LAB — CUP PACEART REMOTE DEVICE CHECK
Date Time Interrogation Session: 20240521173314
Implantable Pulse Generator Implant Date: 20240417

## 2022-10-05 NOTE — Progress Notes (Signed)
Carelink Summary Report / Loop Recorder 

## 2022-10-16 ENCOUNTER — Ambulatory Visit: Payer: 59

## 2022-10-16 DIAGNOSIS — R55 Syncope and collapse: Secondary | ICD-10-CM | POA: Diagnosis not present

## 2022-10-18 LAB — CUP PACEART REMOTE DEVICE CHECK
Date Time Interrogation Session: 20240623231130
Implantable Pulse Generator Implant Date: 20240417

## 2022-10-19 ENCOUNTER — Telehealth: Payer: Self-pay

## 2022-10-19 NOTE — Telephone Encounter (Signed)
Following alert received from [employer] received for a nocturnal pause detected that may have been more than 7 seconds, see ID #[id number], rhythm strip shows ECG suspended for 7 seconds.     Noted on Zio, longest nocturnal pause was 3.4 seconds long in duration. Routing to Dr. Lalla Brothers considering possible lengthy pause even though nocturnal.

## 2022-10-20 ENCOUNTER — Encounter: Payer: Self-pay | Admitting: *Deleted

## 2022-10-20 ENCOUNTER — Encounter: Payer: Self-pay | Admitting: Internal Medicine

## 2022-10-20 ENCOUNTER — Ambulatory Visit: Payer: 59 | Attending: Internal Medicine | Admitting: Internal Medicine

## 2022-10-20 VITALS — BP 102/66 | HR 72 | Ht 68.0 in | Wt 147.0 lb

## 2022-10-20 DIAGNOSIS — I422 Other hypertrophic cardiomyopathy: Secondary | ICD-10-CM | POA: Diagnosis not present

## 2022-10-20 DIAGNOSIS — R55 Syncope and collapse: Secondary | ICD-10-CM

## 2022-10-20 DIAGNOSIS — E7800 Pure hypercholesterolemia, unspecified: Secondary | ICD-10-CM | POA: Diagnosis not present

## 2022-10-20 NOTE — Patient Instructions (Signed)
   Follow-Up: At [facility], you and your health needs are our priority.  As part of our continuing mission to provide you with exceptional heart care, we have created designated Provider Care Teams.  These Care Teams include your primary Cardiologist (physician) and Advanced Practice Providers (APPs -  Physician Assistants and Nurse Practitioners) who all work together to provide you with the care you need, when you need it.  We recommend signing up for the patient portal called "MyChart".  Sign up information is provided on this After Visit Summary.  MyChart is used to connect with patients for Virtual Visits (Telemedicine).  Patients are able to view lab/test results, encounter notes, upcoming appointments, etc.  Non-urgent messages can be sent to your provider as well.   To learn more about what you can do with MyChart, go to [email or web address].    Your next appointment:   6 month(s)  Provider:   DR Jacques Navy

## 2022-10-20 NOTE — Progress Notes (Signed)
Cardiology Office Note:    Date:  10/20/2022  ID:  Tony Merritt, DOB [birth year], MRN [id number]  PCP:  de [place], Raymond J, MD  Cardiologist:  None  Electrophysiologist:  Lanier Prude, MD   Referring MD: de [place], Raymond J, MD   Chief Complaint/Reason for Referral: F/u syncope and Apical HCM   History of Present Illness:    Tony Merritt is a 47 y.o. male with a history of syncope who presents for follow up of apical HCM and syncope immediately post peak exercise.  10/20/22: Feels well overall, has had no recurrent syncope since our initial encounter in the hospital.  He has stopped running and now primarily rides his bike to and from work.  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  He has been participating in light duty/ground-level work, with no recurrent syncope may be reasonable for him to return to his usual work activities.  Cautioned that if he is feeling poorly he should not ascend a ladder nor participate in work that requires him to be in a bucket for a prolonged period of time.  He will be alert to the symptoms.  We discussed dietary recommendations including the Mediterranean diet.  He does have historical hyperlipidemia, but labs have not been checked in 2 years.  He has these coordinated through his primary care doctor and will continue to pursue it through the [facility] office.  Denies chest pain denies significant shortness of breath.  Does continue to have trouble climbing stairs quickly which will sometimes exacerbate his presyncopal symptoms.  He is being cautious not to do so.  We reviewed my conversation with sports cardiologist regarding his MRI images which do seem most suggestive of apical hypertrophic cardiomyopathy.  Prior visits:  He is feeling well overall after our initial encounter in the emergency department.  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  We pursued MRI, treadmill  test, and cardiac monitor to screen in the setting of syncope.  Cardiac MRI showed an indexed wall thickness consistent with probable apical hypertrophic cardiomyopathy, see MRI report for details.  We discussed in detail that his wall thickness is not classically thickened by MRI, but based on apical to basal ratio and index wall thickness, this is the most likely diagnosis.  There is minimal delayed enhancement on MRI, certainly less than 15% of the myocardial mass, seen in the mid ventricular inferior RV insertion point, and faint mid myocardial LGE in the lateral apex and apical segment.  Overall low risk pattern of LGE.  Suspect that the RV insertion point LGE is more related to his endurance athleticism with distance bike rides.  There is also borderline right ventricular enlargement by indexed volume and this may also represent that.  Exercise treadmill test was overall low risk.  Study was nondiagnostic for ischemia due to baseline EKG, despite interpretation.  Patient had excellent exercise capacity.  He did have dizziness immediately post stress but did not experience syncope.  No arrhythmia at peak stress.  Cardiac monitor demonstrated greater than 3 seconds pauses, overnight while patient was asleep.  Patient had no significant symptoms during cardiac monitor (2 weeks) but also scaled back significantly on his activity given my recommendations for not pursuing extreme exercise until workup complete.  No episodes of syncope during monitor.  Overall these findings suggest that with a diagnosis of apical hypertrophic cardiomyopathy this is likely a low risk phenotype and his syncope is unlikely to be due  to malignant ventricular arrhythmia or obstructive ventricular phenomenon.  We do not yet have an etiology for his syncope.  He did have an elevated blood pressure with peak exercise and I suspect that this falls quite rapidly when he stops exercising and this sudden drop in blood pressure is likely  the cause of his post peak exertion syncope and presyncope.  We discussed in detail that given findings on cardiac monitor, we will plan for EP evaluation.  The patient does snore approximately once or twice a week, no endorsed apneic episodes.  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  He works for the city of [place].  He does on occasion need to climb ladders and be at an elevated height for his work.  He is doing light duty and groundwork at this time.  Until EP evaluation is complete, I have requested that he continue to do groundwork only.  This is because I remain concerned about the etiology of his syncope and remain concerned that if he were on a ladder and experienced syncope, this could be life-threatening.  He and his wife who was present for the visit today understand and are in agreement with continued restrictions.  His toe inflammation has healed well and was attributed to ingrown toenail.   No past medical history on file.  Past Surgical History:  Procedure Laterality Date   KNEE SURGERY     SHOULDER ARTHROSCOPY      Current Medications: No outpatient medications have been marked as taking for the 10/20/22 encounter (Office Visit) with Parke Poisson, MD.   Outpatient Encounter Medications as of 10/20/2022  Medication Sig   Cholecalciferol (VITAMIN D-3 PO) Take 1 capsule by mouth daily.   No facility-administered encounter medications on file as of 10/20/2022.     Allergies:   Shellfish allergy and Shrimp (diagnostic)   Social History   Tobacco Use   Smoking status: Never   Smokeless tobacco: Never  Vaping Use   Vaping Use: Never used  Substance Use Topics   Alcohol use: Not Currently   Drug use: Not Currently     Family History: The patient's family history includes Cancer in his maternal grandmother and maternal uncle; Healthy in his father and mother; Heart attack in his maternal grandfather, paternal grandfather,  and paternal grandmother.  ROS:   Please see the history of present illness.   All other systems reviewed and are negative.  EKGs/Labs/Other Studies Reviewed:    The following studies were reviewed today:  ECHO 05/30/2022:   IMPRESSIONS     1. Left ventricular ejection fraction, by estimation, is 60 to 65%. The  left ventricle has normal function. The left ventricle has no regional  wall motion abnormalities. There is mild asymmetric left ventricular  hypertrophy of the apical segment. Left  ventricular diastolic parameters were normal.   2. Right ventricular systolic function is normal. The right ventricular  size is normal.   3. The mitral valve is normal in structure. No evidence of mitral valve  regurgitation. No evidence of mitral stenosis.   4. The aortic valve is normal in structure. Aortic valve regurgitation is  not visualized. No aortic stenosis is present.   5. The inferior vena cava is normal in size with greater than 50%  respiratory variability, suggesting right atrial pressure of 3 mmHg.   Conclusion(s)/Recommendation(s): Findings consistent with hypertrophic cardiomyopathy _ apical variant. The degree of hypertrophy is mild and there is no apical aneurysm  formation/entrapment yet. May be early in the development of the full phenotypic  changes. Suggest correlation with cardiac MRI.   EKG:  The EKG is personally reviewed The EKG was not ordered today.  Imaging studies that I have independently reviewed today: cardiac MRI, ETT.   Recent Labs: 05/30/2022: BUN 27; Creatinine, Ser 1.24; Hemoglobin 15.4; Platelets 155; Potassium 4.5; Sodium 140  Recent Lipid Panel    Component Value Date/Time   CHOL 266 (H) 08/11/2020 1631   TRIG 131 08/11/2020 1631   HDL 72 08/11/2020 1631   CHOLHDL 3.7 08/11/2020 1631   VLDL 26 08/11/2020 1631   LDLCALC 168 (H) 08/11/2020 1631    Physical Exam:    VS:  BP 102/66   Pulse 72   Ht 5\' 8"  (1.727 m)   Wt 147 lb (66.7 kg)    SpO2 95%   BMI 22.35 kg/m     Wt Readings from Last 5 Encounters:  10/20/22 147 lb (66.7 kg)  08/09/22 147 lb 9.6 oz (67 kg)  07/11/22 145 lb (65.8 kg)  07/06/22 145 lb (65.8 kg)  06/08/22 148 lb 1.6 oz (67.2 kg)    Constitutional: No acute distress Eyes: sclera non-icteric, normal conjunctiva and lids ENMT: normal dentition, moist mucous membranes Cardiovascular: regular rhythm, normal rate, no murmur. S1 and S2 normal. No jugular venous distention.  Respiratory: clear to auscultation bilaterally GI : normal bowel sounds, soft and nontender. No distention.   MSK: extremities warm, well perfused. No edema.  NEURO: grossly nonfocal exam, moves all extremities. PSYCH: alert and oriented x 3, normal mood and affect.   ASSESSMENT:    1. Syncope and collapse   2. Apical variant hypertrophic cardiomyopathy (HCC)   3. Pure hypercholesterolemia     PLAN:    Syncope and collapse  Apical hypertrophic cardiomyopathy (HCC)   -He has an implantable loop recorder in place that is monitored by Dr. Lalla Brothers and electrophysiology.  No daytime rhythm issues.  He does continue to have nocturnal pauses.  Patient wonders if he is able to elevate his resting heart rate if this may benefit.  I still suspect that this is vagally mediated.  Continue to monitor symptoms and participate in aerobic activity without reaching maximum 10 out of 10 effort. -Noted to have an elevated cholesterol, but has not had this checked in 2 years.  Repeat labs per primary as ordered.  I will be happy to review these when they are performed, and we may want to consider cholesterol-lowering therapy since he feels hyperlipidemia is genetic in his family.  Total time of encounter: 25 minutes total time of encounter, including 15 minutes spent in face-to-face patient care on the date of this encounter. This time includes coordination of care and counseling regarding above mentioned problem list. Remainder of non-face-to-face  time involved reviewing chart documents/testing relevant to the patient encounter and documentation in the medical record. I have independently reviewed documentation from referring provider.   Tony Brass, MD, [facility]  [facility]     Shared Decision Making/Informed Consent:       Medication Adjustments/Labs and Tests Ordered: Current medicines are reviewed at length with the patient today.  Concerns regarding medicines are outlined above.   No orders of the defined types were placed in this encounter.   No orders of the defined types were placed in this encounter.   Patient Instructions    Follow-Up: At [facility], you and your health needs are our priority.  As  part of our continuing mission to provide you with exceptional heart care, we have created designated Provider Care Teams.  These Care Teams include your primary Cardiologist (physician) and Advanced Practice Providers (APPs -  Physician Assistants and Nurse Practitioners) who all work together to provide you with the care you need, when you need it.  We recommend signing up for the patient portal called "MyChart".  Sign up information is provided on this After Visit Summary.  MyChart is used to connect with patients for Virtual Visits (Telemedicine).  Patients are able to view lab/test results, encounter notes, upcoming appointments, etc.  Non-urgent messages can be sent to your provider as well.   To learn more about what you can do with MyChart, go to [email or web address].    Your next appointment:   6 month(s)  Provider:   DR Jacques Navy

## 2022-10-30 ENCOUNTER — Telehealth: Payer: Self-pay | Admitting: Internal Medicine

## 2022-10-30 NOTE — Telephone Encounter (Signed)
   Pre-operative Risk Assessment    Patient Name: Tony Merritt  DOB: 11-23-1975 MRN: [id number]      Request for Surgical Clearance    Procedure:  wants to know if they can use Ultrasonic Scaler with his pacemaker?   no extractions  hen is this surgery scheduled? Press F2 to enter date below and place date in Reason for Visit (see directions below). :1} Date of Surgery:  Clearance      TBD                             Surgeon: Drr  Roma Schanz Surgeon's Group or Practice Name:   Phone number: [phone]  Fax number:  [phone]- not in Epic   Type of Clearance Requested:   Neither    Type of Anesthesia:  None    Additional requests/questions:    Signed, Laurence Ferrari   10/30/2022, 4:13 PM

## 2022-10-31 NOTE — Telephone Encounter (Signed)
Spoke to [place], advised patient has loop recorder and no issues with the device and procedure. Appreciative of call.

## 2022-11-06 NOTE — Progress Notes (Signed)
 Carelink Summary Report / Loop Recorder 

## 2022-11-20 ENCOUNTER — Ambulatory Visit (INDEPENDENT_AMBULATORY_CARE_PROVIDER_SITE_OTHER): Payer: 59

## 2022-11-20 DIAGNOSIS — R55 Syncope and collapse: Secondary | ICD-10-CM

## 2022-12-07 NOTE — Progress Notes (Signed)
Carelink Summary Report / Loop Recorder 

## 2022-12-15 ENCOUNTER — Other Ambulatory Visit (HOSPITAL_BASED_OUTPATIENT_CLINIC_OR_DEPARTMENT_OTHER): Payer: Self-pay

## 2022-12-15 DIAGNOSIS — Z0000 Encounter for general adult medical examination without abnormal findings: Secondary | ICD-10-CM

## 2022-12-16 LAB — LIPID PANEL
Chol/HDL Ratio: 3.6 ratio (ref 0.0–5.0)
Cholesterol, Total: 256 mg/dL — ABNORMAL HIGH (ref 100–199)
HDL: 72 mg/dL (ref >39)
LDL Chol Calc (NIH): 178 mg/dL — ABNORMAL HIGH (ref 0–99)
Triglycerides: 44 mg/dL (ref 0–149)
VLDL Cholesterol Cal: 6 mg/dL (ref 5–40)

## 2022-12-16 LAB — CBC WITH DIFFERENTIAL/PLATELET
Basophils Absolute: 0.1 10*3/uL (ref 0.0–0.2)
Basos: 2 %
EOS (ABSOLUTE): 0.1 10*3/uL (ref 0.0–0.4)
Eos: 4 %
Hematocrit: 48 % (ref 37.5–51.0)
Hemoglobin: 15.9 g/dL (ref 13.0–17.7)
Immature Grans (Abs): 0 10*3/uL (ref 0.0–0.1)
Immature Granulocytes: 0 %
Lymphocytes Absolute: 1.4 10*3/uL (ref 0.7–3.1)
Lymphs: 43 %
MCH: 30.1 pg (ref 26.6–33.0)
MCHC: 33.1 g/dL (ref 31.5–35.7)
MCV: 91 fL (ref 79–97)
Monocytes Absolute: 0.3 10*3/uL (ref 0.1–0.9)
Monocytes: 10 %
Neutrophils Absolute: 1.3 10*3/uL — ABNORMAL LOW (ref 1.4–7.0)
Neutrophils: 41 %
Platelets: 170 10*3/uL (ref 150–450)
RBC: 5.28 x10E6/uL (ref 4.14–5.80)
RDW: 12.8 % (ref 11.6–15.4)
WBC: 3.2 10*3/uL — ABNORMAL LOW (ref 3.4–10.8)

## 2022-12-16 LAB — COMPREHENSIVE METABOLIC PANEL
ALT: 18 IU/L (ref 0–44)
AST: 31 IU/L (ref 0–40)
Albumin: 5 g/dL (ref 4.1–5.1)
Alkaline Phosphatase: 66 IU/L (ref 44–121)
BUN/Creatinine Ratio: 14 (ref 9–20)
BUN: 17 mg/dL (ref 6–24)
Bilirubin Total: 0.9 mg/dL (ref 0.0–1.2)
CO2: 22 mmol/L (ref 20–29)
Calcium: 10.1 mg/dL (ref 8.7–10.2)
Chloride: 101 mmol/L (ref 96–106)
Creatinine, Ser: 1.18 mg/dL (ref 0.76–1.27)
Globulin, Total: 2.2 g/dL (ref 1.5–4.5)
Glucose: 79 mg/dL (ref 70–99)
Potassium: 4.7 mmol/L (ref 3.5–5.2)
Sodium: 139 mmol/L (ref 134–144)
Total Protein: 7.2 g/dL (ref 6.0–8.5)
eGFR: 77 mL/min/{1.73_m2} (ref >59)

## 2022-12-16 LAB — TSH RFX ON ABNORMAL TO FREE T4: TSH: 2.67 u[IU]/mL (ref 0.450–4.500)

## 2022-12-21 LAB — CUP PACEART REMOTE DEVICE CHECK
Date Time Interrogation Session: 20240828231402
Implantable Pulse Generator Implant Date: 20240417

## 2022-12-26 ENCOUNTER — Ambulatory Visit (INDEPENDENT_AMBULATORY_CARE_PROVIDER_SITE_OTHER): Payer: 59

## 2022-12-26 DIAGNOSIS — R55 Syncope and collapse: Secondary | ICD-10-CM | POA: Diagnosis not present

## 2023-01-04 NOTE — Progress Notes (Signed)
Carelink Summary Report / Loop Recorder 

## 2023-01-18 ENCOUNTER — Encounter (HOSPITAL_BASED_OUTPATIENT_CLINIC_OR_DEPARTMENT_OTHER): Payer: Self-pay | Admitting: Family Medicine

## 2023-01-18 ENCOUNTER — Ambulatory Visit (INDEPENDENT_AMBULATORY_CARE_PROVIDER_SITE_OTHER): Payer: 59

## 2023-01-18 ENCOUNTER — Encounter (HOSPITAL_BASED_OUTPATIENT_CLINIC_OR_DEPARTMENT_OTHER): Payer: Self-pay | Admitting: *Deleted

## 2023-01-18 ENCOUNTER — Ambulatory Visit (HOSPITAL_BASED_OUTPATIENT_CLINIC_OR_DEPARTMENT_OTHER): Payer: 59 | Admitting: Family Medicine

## 2023-01-18 VITALS — BP 118/82 | HR 51 | Ht 68.0 in | Wt 141.9 lb

## 2023-01-18 DIAGNOSIS — H6121 Impacted cerumen, right ear: Secondary | ICD-10-CM

## 2023-01-18 DIAGNOSIS — Z23 Encounter for immunization: Secondary | ICD-10-CM

## 2023-01-18 DIAGNOSIS — M79643 Pain in unspecified hand: Secondary | ICD-10-CM | POA: Insufficient documentation

## 2023-01-18 DIAGNOSIS — R55 Syncope and collapse: Secondary | ICD-10-CM | POA: Diagnosis not present

## 2023-01-18 DIAGNOSIS — M79641 Pain in right hand: Secondary | ICD-10-CM | POA: Diagnosis not present

## 2023-01-18 DIAGNOSIS — H6120 Impacted cerumen, unspecified ear: Secondary | ICD-10-CM | POA: Insufficient documentation

## 2023-01-18 NOTE — Assessment & Plan Note (Addendum)
Patient reports that for about 5 days now he has had some crackling in the right ear.  Has not had any significant pain, possibly mild decreased hearing on the right side.  He has had prior episode in the past which resulted from wax buildup.  Previously, this required lavage from medical professional.  Given symptoms are similar to prior episode, he presents today for further evaluation.  He has not had recent illness, however does report that he will have his allergies bothering him more from the fall.  No significant sinus congestion reported.  Denies any fever, chills, sweats.  Has not had any specific ear pain. On exam, patient is in no acute distress.  Left ear with clear external auditory canal, normal-appearing tympanic membrane.  Right ear with moderate cerumen noted, obscuring large portion of tympanic membrane.  Lavage completed in office with removal of most of cerumen within right EAC.  On repeat exam, cerumen has largely been cleared out.  Tympanic membrane normal-appearing, no significant fluid noted, no bulging. Discussed possible considerations related to observed symptoms.  Could be related to notable cerumen which is now resolved.  May be related to sinus congestion, mild fluid within middle ear.  Recommend use of antihistamine to help with any ongoing sinus congestion, did discuss nasal sprays as well.  We can continue to monitor symptoms moving forward.  If continue to be a problem, possibly place referral to ENT

## 2023-01-18 NOTE — Assessment & Plan Note (Signed)
About 2 months ago, patient reports that he tripped and fell and injured his hand.  Reports that end of his pinky on the right hand hit into the garbage can jamming his finger.  He had pain at that time which has persisted, particularly around MCP.  Has not had any significant swelling that is noted, no skin changes.  Pain is mostly appreciated when shaking hands with someone. On exam, patient has some tenderness to palpation around MC P at base of pinky, no skin changes, no swelling.  Normal range of motion. Can proceed with initial x-rays.  If x-rays are unremarkable, consider referral to hand specialist.

## 2023-01-18 NOTE — Assessment & Plan Note (Signed)
In the office, following administration of influenza vaccine as well as right ear lavage, patient did have syncopal episode which lasted less than 30 seconds.  Prior to onset, patient did feel as though he was about to pass out and communicated this.  During episode, patient was not conscious during episode.  Upon resolution of episode, patient reported that he felt as though he had been "out of it" for 30 minutes or more.  He had notable pallor upon regaining consciousness, experienced diaphoresis, some nausea.  He did not have any vomiting in the office.  He did have fatigue following episode. After episode, blood pressure was similar to initial reading at beginning of office visit, similarly with heart rate.  Blood sugar was also checked which was normal at 108.  Oxygenation was normal as well. Patient was observed for greater than 30 minutes during which time he did not have any recurrence of symptoms.  He was able to tolerate food and water by mouth without any issue.  He does report that he has had prior vasovagal episodes in the past.  Prior to leaving the office, patient felt mostly back to baseline and did not have any further concerns at this time.  He was cautioned on potential return of symptoms.  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

## 2023-01-18 NOTE — Progress Notes (Signed)
Procedures performed today:    None.  Independent interpretation of notes and tests performed by another provider:   None.  Brief History, Exam, Impression, and Recommendations:    BP 118/82 (BP Location: Right Arm, Patient Position: Sitting, Cuff Size: Normal)   Pulse (!) 51   Ht 5\' 8"  (1.727 m)   Wt 141 lb 14.4 oz (64.4 kg)   SpO2 98%   BMI 21.58 kg/m   Impacted cerumen of right ear Assessment & Plan: Patient reports that for about 5 days now he has had some crackling in the right ear.  Has not had any significant pain, possibly mild decreased hearing on the right side.  He has had prior episode in the past which resulted from wax buildup.  Previously, this required lavage from medical professional.  Given symptoms are similar to prior episode, he presents today for further evaluation.  He has not had recent illness, however does report that he will have his allergies bothering him more from the fall.  No significant sinus congestion reported.  Denies any fever, chills, sweats.  Has not had any specific ear pain. On exam, patient is in no acute distress.  Left ear with clear external auditory canal, normal-appearing tympanic membrane.  Right ear with moderate cerumen noted, obscuring large portion of tympanic membrane.  Lavage completed in office with removal of most of cerumen within right EAC.  On repeat exam, cerumen has largely been cleared out.  Tympanic membrane normal-appearing, no significant fluid noted, no bulging. Discussed possible considerations related to observed symptoms.  Could be related to notable cerumen which is now resolved.  May be related to sinus congestion, mild fluid within middle ear.  Recommend use of antihistamine to help with any ongoing sinus congestion, did discuss nasal sprays as well.  We can continue to monitor symptoms moving forward.  If continue to be a problem, possibly place referral to ENT   Encounter for immunization -     Flu vaccine  trivalent PF, 6mos and older(Flulaval,Afluria,Fluarix,Fluzone)  Vasovagal syncope Assessment & Plan: In the office, following administration of influenza vaccine as well as right ear lavage, patient did have syncopal episode which lasted less than 30 seconds.  Prior to onset, patient did feel as though he was about to pass out and communicated this.  During episode, patient was not conscious during episode.  Upon resolution of episode, patient reported that he felt as though he had been "out of it" for 30 minutes or more.  He had notable pallor upon regaining consciousness, experienced diaphoresis, some nausea.  He did not have any vomiting in the office.  He did have fatigue following episode. After episode, blood pressure was similar to initial reading at beginning of office visit, similarly with heart rate.  Blood sugar was also checked which was normal at 108.  Oxygenation was normal as well. Patient was observed for greater than 30 minutes during which time he did not have any recurrence of symptoms.  He was able to tolerate food and water by mouth without any issue.  He does report that he has had prior vasovagal episodes in the past.  Prior to leaving the office, patient felt mostly back to baseline and did not have any further concerns at this time.  He was cautioned on potential return of symptoms.  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   Pain of right hand Assessment & Plan: About 2  months ago, patient reports that he tripped and fell and injured his hand.  Reports that end of his pinky on the right hand hit into the garbage can jamming his finger.  He had pain at that time which has persisted, particularly around MCP.  Has not had any significant swelling that is noted, no skin changes.  Pain is mostly appreciated when shaking hands with someone. On exam, patient has some tenderness to palpation around MC P at base of pinky, no skin  changes, no swelling.  Normal range of motion. Can proceed with initial x-rays.  If x-rays are unremarkable, consider referral to hand specialist.  Orders: -     DG Hand Complete Right; Future  Return if symptoms worsen or fail to improve.   ___________________________________________ Filippa Yarbough de [place], MD, ABFM, [facility]

## 2023-01-19 ENCOUNTER — Telehealth: Payer: Self-pay

## 2023-01-19 NOTE — Telephone Encounter (Signed)
Alert received from [employer]:  Alert remote transmission: Pause detection. 1 Pause episode on 01/18/23 at 09:25, 5 sec device defined, ECG c/w third degree heart block of indeterminate duration; cannot exclude V waves in artifact. Routed to clinic for review.   Per review of strip-Pt appears to go into a 2:1 block before changing to 4 consecutive non conducted P waves and then a pause.  Outreach made to Pt.  Per Pt he passed out yesterday during the time of the pause recorded by the loop recorder.  Per Pt he was at his PCP office having his ear cleaned.  It was advised to Pt at that time that the episode was a vagal response.  Discussed with Dr. Lalla Brothers and Mardelle Matte.  Deemed to be a vagal episode.  Advised Pt to avoid triggers.  Ok to drive.  Will continue to monitor.  Pt aware.

## 2023-01-22 ENCOUNTER — Encounter: Payer: Self-pay | Admitting: Internal Medicine

## 2023-01-24 LAB — CUP PACEART REMOTE DEVICE CHECK
Date Time Interrogation Session: 20240930231820
Implantable Pulse Generator Implant Date: 20240417

## 2023-01-29 ENCOUNTER — Ambulatory Visit: Payer: 59

## 2023-01-29 DIAGNOSIS — R55 Syncope and collapse: Secondary | ICD-10-CM | POA: Diagnosis not present

## 2023-02-07 ENCOUNTER — Ambulatory Visit: Payer: 59 | Attending: Student | Admitting: Student

## 2023-02-07 ENCOUNTER — Encounter: Payer: Self-pay | Admitting: Student

## 2023-02-07 VITALS — BP 110/76 | HR 46 | Ht 68.0 in | Wt 147.0 lb

## 2023-02-07 DIAGNOSIS — I422 Other hypertrophic cardiomyopathy: Secondary | ICD-10-CM

## 2023-02-07 NOTE — Progress Notes (Signed)
   Electrophysiology Office Note:   Date:  02/07/2023  ID:  Tony Merritt, DOB [birth year], MRN [id number]  Primary Cardiologist: None Electrophysiologist: Lanier Prude, MD      History of Present Illness:   Tony Merritt is a 47 y.o. male with h/o syncope seen today for routine electrophysiology followup.   Pt had episode of syncope 01/18/2023 that was associated with vagal slowing and brief heart block. Reviewed with Dr. Lalla Brothers who agreed with vagal episode, no indication for pacing.   Since last being seen in our clinic the patient reports doing well overall.  he denies chest pain, palpitations, dyspnea, PND, orthopnea, nausea, vomiting, dizziness, syncope, edema, weight gain, or early satiety.   Review of systems complete and found to be negative unless listed in HPI.   Device History: Medtronic loop recorder implanted 08/09/2022 for syncope  Studies Reviewed:    EKG is ordered today. Personal review as below.  EKG Interpretation Date/Time:  Wednesday February 07 2023 12:02:55 EDT Ventricular Rate:  46 PR Interval:  168 QRS Duration:  100 QT Interval:  456 QTC Calculation: 399 R Axis:   98  Text Interpretation: Sinus bradycardia Biventricular hypertrophy ST & Marked T-wave abnormality, consider inferolateral ischemia Confirmed by Maxine Glenn [phone]) on 02/07/2023 12:12:20 PM     Cardiac Monitor 06/2022 Impressions: No ventricular arrhythmia. Pauses with sinus bradycardia, pauses appear nocturnal. No syncope documented during monitor duration.   cMRI 06/2022 1. 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 See Findings for measurements. 2. There is a small amount of post contrast delayed myocardial enhancement: Mid ventricular inferior RV insertion point focal LGE. Faint, midmyocardial LGE in the lateral apex and apical segment. All late enhancement is <15% of the myocardial mass. 3.  Normal left  ventricular chamber size and systolic function. 4. Borderline right ventricular chamber enlargement by indexed volume with normal right ventricular systolic function. 5.  No significant valvular heart disease. 6.  Mild dilation of ascending aorta at sinus of Valsalva, 43 mm.  Physical Exam:   VS:  BP 110/76   Pulse (!) 46   Ht 5\' 8"  (1.727 m)   Wt 147 lb (66.7 kg)   SpO2 99%   BMI 22.35 kg/m    Wt Readings from Last 3 Encounters:  02/07/23 147 lb (66.7 kg)  01/18/23 141 lb 14.4 oz (64.4 kg)  10/20/22 147 lb (66.7 kg)     GEN: Well nourished, well developed in no acute distress NECK: No JVD; No carotid bruits CARDIAC: Regular rate and rhythm, no murmurs, rubs, gallops RESPIRATORY:  Clear to auscultation without rales, wheezing or rhonchi  ABDOMEN: Soft, non-tender, non-distended EXTREMITIES:  No edema; No deformity   ILR Interrogation- reviewed in detail today,  See PACEART report  ASSESSMENT AND PLAN:    Syncope s/p Medtronic Loop recorder Normal device function See Pace Art report No changes today  Vasovagal syncope In setting of having his ears cleaned and getting a flu shot during a PCP visit.  No further.  Avoid triggers. Follow.   Patient provided paperwork that his Loop Implant was denied due to "lack of medical necessity".   Per the same paperwork pt meets criteria for loop recorder given "Recurrent or unexplained infrequent syncope".  Will submit records to [facility].   Follow up with Dr. Lalla Brothers in 6 months  Signed, Graciella Freer, PA-C

## 2023-02-07 NOTE — Patient Instructions (Signed)
Medication Instructions:  Your physician recommends that you continue on your current medications as directed. Please refer to the Current Medication list given to you today.  *If you need a refill on your cardiac medications before your next appointment, please call your pharmacy*  Lab Work: None ordered If you have labs (blood work) drawn today and your tests are completely normal, you will receive your results only by: MyChart Message (if you have MyChart) OR A paper copy in the mail If you have any lab test that is abnormal or we need to change your treatment, we will call you to review the results.  Follow-Up: At [facility], you and your health needs are our priority.  As part of our continuing mission to provide you with exceptional heart care, we have created designated Provider Care Teams.  These Care Teams include your primary Cardiologist (physician) and Advanced Practice Providers (APPs -  Physician Assistants and Nurse Practitioners) who all work together to provide you with the care you need, when you need it.  Your next appointment:   6 month(s)  Provider:   Steffanie Dunn, MD

## 2023-02-08 ENCOUNTER — Other Ambulatory Visit (HOSPITAL_BASED_OUTPATIENT_CLINIC_OR_DEPARTMENT_OTHER): Payer: Self-pay | Admitting: Family Medicine

## 2023-02-08 DIAGNOSIS — M79641 Pain in right hand: Secondary | ICD-10-CM

## 2023-02-13 NOTE — Progress Notes (Signed)
Carelink Summary Report / Loop Recorder 

## 2023-02-21 ENCOUNTER — Ambulatory Visit (HOSPITAL_BASED_OUTPATIENT_CLINIC_OR_DEPARTMENT_OTHER): Payer: 59 | Admitting: Orthopaedic Surgery

## 2023-02-28 LAB — CUP PACEART REMOTE DEVICE CHECK
Date Time Interrogation Session: 20241102230940
Implantable Pulse Generator Implant Date: 20240417

## 2023-03-05 ENCOUNTER — Ambulatory Visit (INDEPENDENT_AMBULATORY_CARE_PROVIDER_SITE_OTHER): Payer: 59

## 2023-03-05 DIAGNOSIS — R55 Syncope and collapse: Secondary | ICD-10-CM

## 2023-03-30 ENCOUNTER — Ambulatory Visit (INDEPENDENT_AMBULATORY_CARE_PROVIDER_SITE_OTHER): Payer: 59

## 2023-03-30 DIAGNOSIS — R55 Syncope and collapse: Secondary | ICD-10-CM

## 2023-03-30 NOTE — Progress Notes (Signed)
Carelink Summary Report / Loop Recorder 

## 2023-04-04 ENCOUNTER — Telehealth: Payer: Self-pay | Admitting: Cardiology

## 2023-04-04 NOTE — Telephone Encounter (Signed)
Spoke with the patient who states that his appeal was denied for his loop recorder implant. A letter was sent from Dr. Lalla Brothers stating why it was medically necessary, however they are still denying his claim. Patient is wanting to know what his next step should be.

## 2023-04-04 NOTE — Telephone Encounter (Signed)
Pt called in stating he had a loop recorder placed earlier this year and insurance has denied and denied again about the appeal stating they it was not medically necessary. Please advise if there is anything can be done on this end.

## 2023-04-06 DIAGNOSIS — R55 Syncope and collapse: Secondary | ICD-10-CM

## 2023-04-06 LAB — CUP PACEART REMOTE DEVICE CHECK
Date Time Interrogation Session: 20241205231253
Implantable Pulse Generator Implant Date: 20240417

## 2023-04-09 NOTE — Telephone Encounter (Signed)
Patient called to follow-up on the status of his appeal for loop recorder implant.

## 2023-04-12 ENCOUNTER — Telehealth: Payer: Self-pay | Admitting: Student

## 2023-04-12 NOTE — Telephone Encounter (Signed)
Patient advised his insurance is not covering the insertion of the device due to "not medically necessary."   Patient states he has until January 13 th 2025 until the denial is closed. States he has a bill for $19,400.  Patient also had paper work available if needed to submit to insurance.

## 2023-04-12 NOTE — Telephone Encounter (Signed)
Pt called in stating his loop recorder does not feel like it is placed right. Please advise.

## 2023-04-13 NOTE — Telephone Encounter (Signed)
Spoke with the patient and scheduled him for an appointment on 12/31.

## 2023-04-24 ENCOUNTER — Ambulatory Visit: Payer: 59 | Attending: Student | Admitting: Student

## 2023-04-24 ENCOUNTER — Encounter: Payer: Self-pay | Admitting: Student

## 2023-04-24 VITALS — BP 106/71 | HR 51 | Ht 68.0 in | Wt 146.6 lb

## 2023-04-24 DIAGNOSIS — I422 Other hypertrophic cardiomyopathy: Secondary | ICD-10-CM | POA: Diagnosis not present

## 2023-04-24 DIAGNOSIS — R55 Syncope and collapse: Secondary | ICD-10-CM | POA: Diagnosis not present

## 2023-04-24 NOTE — Patient Instructions (Signed)
Medication Instructions:   Your physician recommends that you continue on your current medications as directed. Please refer to the Current Medication list given to you today.   *If you need a refill on your cardiac medications before your next appointment, please call your pharmacy*   Lab Work: NONE ORDERED  TODAY    If you have labs (blood work) drawn today and your tests are completely normal, you will receive your results only by: MyChart Message (if you have MyChart) OR A paper copy in the mail If you have any lab test that is abnormal or we need to change your treatment, we will call you to review the results.   Testing/Procedures: NONE ORDERED  TODAY    Follow-Up: At [facility], you and your health needs are our priority.  As part of our continuing mission to provide you with exceptional heart care, we have created designated Provider Care Teams.  These Care Teams include your primary Cardiologist (physician) and Advanced Practice Providers (APPs -  Physician Assistants and Nurse Practitioners) who all work together to provide you with the care you need, when you need it.  We recommend signing up for the patient portal called "MyChart".  Sign up information is provided on this After Visit Summary.  MyChart is used to connect with patients for Virtual Visits (Telemedicine).  Patients are able to view lab/test results, encounter notes, upcoming appointments, etc.  Non-urgent messages can be sent to your provider as well.   To learn more about what you can do with MyChart, go to [email or web address].    Your next appointment:   6 month(s)  Provider:   Cameron Lambert, MD    Other Instructions  

## 2023-04-24 NOTE — Progress Notes (Signed)
   Electrophysiology Office Note:   Date:  04/24/2023  ID:  Prentice LABOR Bega, DOB 1975-11-17, MRN [id number]  Primary Cardiologist: None Electrophysiologist: OLE ONEIDA HOLTS, MD      History of Present Illness:   Tony Merritt is a 47 y.o. male with h/o syncope and apical hypertrophic cardiomyopathy seen today for acute visit due to concerns about his loop recorder site.    Patient reports doing well. Continues to struggle with his insurance company that has continued to deny his loop recorder (Despite clear indication even described within [facility] paperwork as for why he is being denied). Overall,  he denies chest pain, palpitations, dyspnea, PND, orthopnea, nausea, vomiting, dizziness, syncope, edema, weight gain, or early satiety.  He is doing less long distance exercise and notes that his resting HR has increased. He does notice he will bump into his loop recorder with certain exercises, but function is normal.   Review of systems complete and found to be negative unless listed in HPI.   Device History: Medtronic loop recorder implanted 08/09/2022 for syncope   Studies Reviewed:    EKG is not ordered today. EKG from 02/07/2023 reviewed which showed sinus bradycardia at 46 bpm  Cardiac Monitor 06/2022 Impressions: No ventricular arrhythmia. Pauses with sinus bradycardia, pauses appear nocturnal. No syncope documented during monitor duration.    cMRI 06/2022 1. 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 See Findings for measurements. 2. There is a small amount of post contrast delayed myocardial enhancement: Mid ventricular inferior RV insertion point focal LGE. Faint, midmyocardial LGE in the lateral apex and apical segment. All late enhancement is <15% of the myocardial mass. 3.  Normal left ventricular chamber size and systolic function. 4. Borderline right ventricular chamber enlargement by indexed volume with normal right  ventricular systolic function. 5.  No significant valvular heart disease. 6.  Mild dilation of ascending aorta at sinus of Valsalva, 43 mm  Physical Exam:   VS:  There were no vitals taken for this visit.   Wt Readings from Last 3 Encounters:  02/07/23 147 lb (66.7 kg)  01/18/23 141 lb 14.4 oz (64.4 kg)  10/20/22 147 lb (66.7 kg)     GEN: Well nourished, well developed in no acute distress NECK: No JVD; No carotid bruits CARDIAC: Regular rate and rhythm, no murmurs, rubs, gallops RESPIRATORY:  Clear to auscultation without rales, wheezing or rhonchi  ABDOMEN: Soft, non-tender, non-distended EXTREMITIES:  No edema; No deformity   ILR Interrogation- Reviewed via Carelink. No new episodes.     ASSESSMENT AND PLAN:    Infrequent Syncope s/p Medtronic Loop recorder Apical variant HCM Normal device function See Pace Art report No changes today He has clear indication for loop recorder which has already revealed several arrhythmias. He is at high risk for recurrence, and eventually will likely need a PPM. We will continue to work with insurance to try to solve this issue.   Vasovagal syncope In setting of having his ears cleaned and getting a flu shot.  Follow.  Potentially separate from this underlying issues as above and meets criteria for monitoring.   Follow up with Dr. Holts in 6 months  Signed, Tony Prentice Passey, PA-C

## 2023-04-27 ENCOUNTER — Ambulatory Visit: Payer: 59 | Admitting: Student

## 2023-05-04 ENCOUNTER — Ambulatory Visit: Payer: 59

## 2023-05-04 DIAGNOSIS — R55 Syncope and collapse: Secondary | ICD-10-CM

## 2023-05-04 LAB — CUP PACEART REMOTE DEVICE CHECK
Date Time Interrogation Session: 20250109231446
Implantable Pulse Generator Implant Date: 20240417

## 2023-05-11 DIAGNOSIS — R55 Syncope and collapse: Secondary | ICD-10-CM | POA: Diagnosis not present

## 2023-06-08 ENCOUNTER — Ambulatory Visit (INDEPENDENT_AMBULATORY_CARE_PROVIDER_SITE_OTHER): Payer: 59

## 2023-06-08 DIAGNOSIS — R55 Syncope and collapse: Secondary | ICD-10-CM

## 2023-06-08 NOTE — Progress Notes (Signed)
Carelink Summary Report / Loop Recorder

## 2023-06-09 LAB — CUP PACEART REMOTE DEVICE CHECK
Date Time Interrogation Session: 20250213230959
Implantable Pulse Generator Implant Date: 20240417

## 2023-06-13 ENCOUNTER — Encounter: Payer: Self-pay | Admitting: Cardiology

## 2023-06-15 DIAGNOSIS — R55 Syncope and collapse: Secondary | ICD-10-CM | POA: Diagnosis not present

## 2023-07-05 ENCOUNTER — Telehealth (HOSPITAL_BASED_OUTPATIENT_CLINIC_OR_DEPARTMENT_OTHER): Payer: Self-pay | Admitting: *Deleted

## 2023-07-05 NOTE — Telephone Encounter (Signed)
 Copied from CRM [id number]. Topic: Appointments - Appointment Info/Confirmation >> Jul 05, 2023  1:28 PM Tony Merritt wrote: Patient asking if any labs need to be drawn before appt, please call [phone]

## 2023-07-08 ENCOUNTER — Other Ambulatory Visit (HOSPITAL_BASED_OUTPATIENT_CLINIC_OR_DEPARTMENT_OTHER): Payer: Self-pay | Admitting: Family Medicine

## 2023-07-08 DIAGNOSIS — Z0000 Encounter for general adult medical examination without abnormal findings: Secondary | ICD-10-CM

## 2023-07-09 ENCOUNTER — Encounter (HOSPITAL_BASED_OUTPATIENT_CLINIC_OR_DEPARTMENT_OTHER): Payer: Self-pay | Admitting: Family Medicine

## 2023-07-09 NOTE — Telephone Encounter (Signed)
 Separate msg sent via mychart to let him know they had been ordered

## 2023-07-10 ENCOUNTER — Other Ambulatory Visit (HOSPITAL_BASED_OUTPATIENT_CLINIC_OR_DEPARTMENT_OTHER): Payer: Self-pay | Admitting: Family Medicine

## 2023-07-10 LAB — LIPID PANEL
Chol/HDL Ratio: 4 ratio (ref 0.0–5.0)
Cholesterol, Total: 266 mg/dL — ABNORMAL HIGH (ref 100–199)
HDL: 67 mg/dL (ref >39)
LDL Chol Calc (NIH): 188 mg/dL — ABNORMAL HIGH (ref 0–99)
Triglycerides: 71 mg/dL (ref 0–149)
VLDL Cholesterol Cal: 11 mg/dL (ref 5–40)

## 2023-07-10 LAB — COMPREHENSIVE METABOLIC PANEL
ALT: 15 IU/L (ref 0–44)
AST: 19 IU/L (ref 0–40)
Albumin: 4.7 g/dL (ref 4.1–5.1)
Alkaline Phosphatase: 73 IU/L (ref 44–121)
BUN/Creatinine Ratio: 20 (ref 9–20)
BUN: 23 mg/dL (ref 6–24)
Bilirubin Total: 0.7 mg/dL (ref 0.0–1.2)
CO2: 23 mmol/L (ref 20–29)
Calcium: 10 mg/dL (ref 8.7–10.2)
Chloride: 102 mmol/L (ref 96–106)
Creatinine, Ser: 1.16 mg/dL (ref 0.76–1.27)
Globulin, Total: 2.3 g/dL (ref 1.5–4.5)
Glucose: 83 mg/dL (ref 70–99)
Potassium: 4.7 mmol/L (ref 3.5–5.2)
Sodium: 142 mmol/L (ref 134–144)
Total Protein: 7 g/dL (ref 6.0–8.5)
eGFR: 79 mL/min/{1.73_m2} (ref >59)

## 2023-07-10 LAB — CBC WITH DIFFERENTIAL/PLATELET
Basophils Absolute: 0 10*3/uL (ref 0.0–0.2)
Basos: 1 %
EOS (ABSOLUTE): 0.2 10*3/uL (ref 0.0–0.4)
Eos: 4 %
Hematocrit: 48.1 % (ref 37.5–51.0)
Hemoglobin: 16.6 g/dL (ref 13.0–17.7)
Immature Grans (Abs): 0 10*3/uL (ref 0.0–0.1)
Immature Granulocytes: 0 %
Lymphocytes Absolute: 1.6 10*3/uL (ref 0.7–3.1)
Lymphs: 46 %
MCH: 31 pg (ref 26.6–33.0)
MCHC: 34.5 g/dL (ref 31.5–35.7)
MCV: 90 fL (ref 79–97)
Monocytes Absolute: 0.4 10*3/uL (ref 0.1–0.9)
Monocytes: 12 %
Neutrophils Absolute: 1.3 10*3/uL — ABNORMAL LOW (ref 1.4–7.0)
Neutrophils: 37 %
Platelets: 182 10*3/uL (ref 150–450)
RBC: 5.36 x10E6/uL (ref 4.14–5.80)
RDW: 12.6 % (ref 11.6–15.4)
WBC: 3.5 10*3/uL (ref 3.4–10.8)

## 2023-07-10 NOTE — Addendum Note (Signed)
 Addended by: Elease Etienne A on: 07/10/2023 09:42 AM   Modules accepted: Orders

## 2023-07-10 NOTE — Progress Notes (Signed)
 Carelink Summary Report / Loop Recorder

## 2023-07-13 ENCOUNTER — Ambulatory Visit (HOSPITAL_BASED_OUTPATIENT_CLINIC_OR_DEPARTMENT_OTHER): Payer: 59 | Admitting: Family Medicine

## 2023-07-13 ENCOUNTER — Ambulatory Visit: Payer: 59

## 2023-07-13 ENCOUNTER — Encounter (HOSPITAL_BASED_OUTPATIENT_CLINIC_OR_DEPARTMENT_OTHER): Payer: Self-pay | Admitting: Family Medicine

## 2023-07-13 VITALS — BP 112/72 | HR 51 | Ht 68.0 in | Wt 147.7 lb

## 2023-07-13 DIAGNOSIS — Z125 Encounter for screening for malignant neoplasm of prostate: Secondary | ICD-10-CM

## 2023-07-13 DIAGNOSIS — Z0000 Encounter for general adult medical examination without abnormal findings: Secondary | ICD-10-CM

## 2023-07-13 DIAGNOSIS — R55 Syncope and collapse: Secondary | ICD-10-CM

## 2023-07-13 DIAGNOSIS — Z0183 Encounter for blood typing: Secondary | ICD-10-CM

## 2023-07-13 LAB — CUP PACEART REMOTE DEVICE CHECK
Date Time Interrogation Session: 20250320231316
Implantable Pulse Generator Implant Date: 20240417

## 2023-07-13 NOTE — Progress Notes (Signed)
 Subjective:    CC: Annual Physical Exam  HPI:  Tony Merritt is a 48 y.o. presenting for annual physical  I reviewed the past medical history, family history, social history, surgical history, and allergies today and no changes were needed.  Please see the problem list section below in epic for further details.  Past Medical History: History reviewed. No pertinent past medical history. Past Surgical History: Past Surgical History:  Procedure Laterality Date   KNEE SURGERY     SHOULDER ARTHROSCOPY     Social History: Social History   Socioeconomic History   Marital status: Married    Spouse name: Not on file   Number of children: Not on file   Years of education: Not on file   Highest education level: Not on file  Occupational History   Not on file  Tobacco Use   Smoking status: Never    Passive exposure: Never   Smokeless tobacco: Never  Vaping Use   Vaping status: Never Used  Substance and Sexual Activity   Alcohol use: Not Currently   Drug use: Not Currently   Sexual activity: Yes    Birth control/protection: None  Other Topics Concern   Not on file  Social History Narrative   Not on file   Social Drivers of Health   Financial Resource Strain: Low Risk  (01/18/2023)   Overall Financial Resource Strain (CARDIA)    Difficulty of Paying Living Expenses: Not hard at all  Food Insecurity: No Food Insecurity (01/18/2023)   Hunger Vital Sign    Worried About Running Out of Food in the Last Year: Never true    Ran Out of Food in the Last Year: Never true  Transportation Needs: No Transportation Needs (01/18/2023)   PRAPARE - Administrator, Civil Service (Medical): No    Lack of Transportation (Non-Medical): No  Physical Activity: Sufficiently Active (01/18/2023)   Exercise Vital Sign    Days of Exercise per Week: 3 days    Minutes of Exercise per Session: 60 min  Stress: No Stress Concern Present (01/18/2023)   [employer] of Occupational  Health - Occupational Stress Questionnaire    Feeling of Stress : Not at all  Social Connections: Moderately Isolated (01/18/2023)   Social Connection and Isolation Panel [NHANES]    Frequency of Communication with Friends and Family: More than three times a week    Frequency of Social Gatherings with Friends and Family: More than three times a week    Attends Religious Services: Never    Database administrator or Organizations: No    Attends Engineer, structural: Never    Marital Status: Married   Family History: Family History  Problem Relation Age of Onset   Healthy Mother    Healthy Father    Cancer Maternal Uncle        deceased at 45 - unknown type of cancer   Cancer Maternal Grandmother        deceased at age 72 - cancer type unknown   Heart attack Maternal Grandfather        deceased at age 77   Heart attack Paternal Grandmother        deceased at age 56   Heart attack Paternal Grandfather        deceased at age 61   Allergies: Allergies  Allergen Reactions   Shellfish Allergy Anaphylaxis and Swelling    Swelling of mouth, tongue, throat   Shrimp (Diagnostic) Anaphylaxis and  Swelling    Swelling of mouth, tongue, throat   Medications: See med rec.  Review of Systems: No headache, visual changes, nausea, vomiting, diarrhea, constipation, dizziness, abdominal pain, skin rash, fevers, chills, night sweats, swollen lymph nodes, weight loss, chest pain, body aches, joint swelling, muscle aches, shortness of breath, mood changes, visual or auditory hallucinations.  Objective:    BP 112/72 (BP Location: Right Arm, Patient Position: Sitting, Cuff Size: Normal)   Pulse (!) 51   Ht 5\' 8"  (1.727 m)   Wt 147 lb 11.2 oz (67 kg)   SpO2 99%   BMI 22.46 kg/m   General: Well Developed, well nourished, and in no acute distress. Neuro: Alert and oriented x3, extra-ocular muscles intact, sensation grossly intact. Cranial nerves II through XII are intact, motor, sensory,  and coordinative functions are all intact. HEENT: Normocephalic, atraumatic, pupils equal round reactive to light, neck supple, no masses, no lymphadenopathy, thyroid nonpalpable. Oropharynx, nasopharynx, external ear canals are unremarkable. Skin: Warm and dry, no rashes noted. Cardiac: Regular rate and rhythm, no murmurs rubs or gallops. Respiratory: Clear to auscultation bilaterally. Not using accessory muscles, speaking in full sentences. Abdominal: Soft, nontender, nondistended, positive bowel sounds, no masses, no organomegaly. Musculoskeletal: Shoulder, elbow, wrist, hip, knee, ankle stable, and with full range of motion.  Impression and Recommendations:    Wellness examination Assessment & Plan: Routine HCM labs reviewed. HCM reviewed/discussed. Anticipatory guidance regarding healthy weight, lifestyle and choices given. Recommend healthy diet.  Recommend approximately 150 minutes/week of moderate intensity exercise Recommend regular dental and vision exams Always use seatbelt/lap and shoulder restraints Recommend using smoke alarms and checking batteries at least twice a year Recommend using sunscreen when outside Discussed colon cancer screening recommendations, options.  Patient would like to proceed with Cologuard Discussed tetanus immunization recommendations, patient is UTD  Orders: -     PSA Total (Reflex To Free); Future  Prostate cancer screening -     PSA Total (Reflex To Free); Future  Blood typing encounter -     ABO AND RH ; Future  Some increase in urinary frequency. No other symptoms, no dysuria, no hematuria. Does not think he drinks too much water during the day. Infrequent caffeine intake. Can check PSA, will look to add onto labs drawn recently.  Wanting to check blood type. Aware it may not be covered by insurance.  Return in about 1 year (around 07/12/2024) for CPE.   ___________________________________________ Lara Palinkas de [place], MD, ABFM,  [facility] Primary Care and Sports Medicine [facility]

## 2023-07-13 NOTE — Patient Instructions (Signed)
  Medication Instructions:  Your physician recommends that you continue on your current medications as directed. Please refer to the Current Medication list given to you today. --If you need a refill on any your medications before your next appointment, please call your pharmacy first. If no refills are authorized on file call the office.-- Lab Work: Your physician has recommended that you have lab work today: 1 week before next visit If you have labs (blood work) drawn today and your tests are completely normal, you will receive your results via MyChart message OR a phone call from our staff.  Please ensure you check your voicemail in the event that you authorized detailed messages to be left on a delegated number. If you have any lab test that is abnormal or we need to change your treatment, we will call you to review the results.    Follow-Up: Your next appointment:   Your physician recommends that you schedule a follow-up appointment in: 1 year physical with Dr. de [place]  You will receive a text message or e-mail with a link to a survey about your care and experience with [place] today! We would greatly appreciate your feedback!   Thanks for letting us be apart of your health journey!!  Primary Care and Sports Medicine   Dr. Ceasar Mons [place]   We encourage you to activate your patient portal called "MyChart".  Sign up information is provided on this After Visit Summary.  MyChart is used to connect with patients for Virtual Visits (Telemedicine).  Patients are able to view lab/test results, encounter notes, upcoming appointments, etc.  Non-urgent messages can be sent to your provider as well. To learn more about what you can do with MyChart, please visit --  [email or web address].

## 2023-07-13 NOTE — Assessment & Plan Note (Signed)
Routine HCM labs reviewed. HCM reviewed/discussed. Anticipatory guidance regarding healthy weight, lifestyle and choices given. Recommend healthy diet.  Recommend approximately 150 minutes/week of moderate intensity exercise Recommend regular dental and vision exams Always use seatbelt/lap and shoulder restraints Recommend using smoke alarms and checking batteries at least twice a year Recommend using sunscreen when outside Discussed colon cancer screening recommendations, options.  Patient would like to proceed with Cologuard Discussed tetanus immunization recommendations, patient is UTD

## 2023-07-15 ENCOUNTER — Encounter: Payer: Self-pay | Admitting: Cardiology

## 2023-07-16 LAB — PSA TOTAL (REFLEX TO FREE): Prostate Specific Ag, Serum: 1.4 ng/mL (ref 0.0–4.0)

## 2023-07-16 LAB — SPECIMEN STATUS REPORT

## 2023-07-16 LAB — ABO AND RH: Rh Factor: POSITIVE

## 2023-07-17 ENCOUNTER — Encounter (HOSPITAL_BASED_OUTPATIENT_CLINIC_OR_DEPARTMENT_OTHER): Payer: Self-pay | Admitting: Family Medicine

## 2023-07-20 DIAGNOSIS — R55 Syncope and collapse: Secondary | ICD-10-CM | POA: Diagnosis not present

## 2023-08-17 ENCOUNTER — Ambulatory Visit (INDEPENDENT_AMBULATORY_CARE_PROVIDER_SITE_OTHER): Payer: 59

## 2023-08-17 DIAGNOSIS — R55 Syncope and collapse: Secondary | ICD-10-CM

## 2023-08-17 LAB — CUP PACEART REMOTE DEVICE CHECK
Date Time Interrogation Session: 20250424231139
Implantable Pulse Generator Implant Date: 20240417

## 2023-08-18 ENCOUNTER — Encounter: Payer: Self-pay | Admitting: Cardiology

## 2023-08-20 DIAGNOSIS — R55 Syncope and collapse: Secondary | ICD-10-CM | POA: Diagnosis not present

## 2023-08-21 NOTE — Progress Notes (Signed)
 Carelink Summary Report / Loop Recorder

## 2023-09-21 ENCOUNTER — Ambulatory Visit (INDEPENDENT_AMBULATORY_CARE_PROVIDER_SITE_OTHER): Payer: 59

## 2023-09-21 DIAGNOSIS — R55 Syncope and collapse: Secondary | ICD-10-CM

## 2023-09-21 LAB — CUP PACEART REMOTE DEVICE CHECK
Date Time Interrogation Session: 20250529231947
Implantable Pulse Generator Implant Date: 20240417

## 2023-09-23 ENCOUNTER — Ambulatory Visit: Payer: Self-pay | Admitting: Cardiology

## 2023-09-24 NOTE — Progress Notes (Signed)
 Carelink Summary Report / Loop Recorder

## 2023-10-22 ENCOUNTER — Ambulatory Visit

## 2023-10-22 DIAGNOSIS — R55 Syncope and collapse: Secondary | ICD-10-CM | POA: Diagnosis not present

## 2023-10-22 LAB — CUP PACEART REMOTE DEVICE CHECK
Date Time Interrogation Session: 20250629234029
Implantable Pulse Generator Implant Date: 20240417

## 2023-10-24 ENCOUNTER — Ambulatory Visit: Payer: Self-pay | Admitting: Cardiology

## 2023-11-07 NOTE — Progress Notes (Signed)
 Carelink Summary Report / Loop Recorder

## 2023-11-22 ENCOUNTER — Ambulatory Visit (INDEPENDENT_AMBULATORY_CARE_PROVIDER_SITE_OTHER)

## 2023-11-22 DIAGNOSIS — R55 Syncope and collapse: Secondary | ICD-10-CM | POA: Diagnosis not present

## 2023-11-22 LAB — CUP PACEART REMOTE DEVICE CHECK
Date Time Interrogation Session: 20250730233613
Implantable Pulse Generator Implant Date: 20240417

## 2023-11-26 ENCOUNTER — Ambulatory Visit: Payer: Self-pay | Admitting: Cardiology

## 2023-12-20 ENCOUNTER — Ambulatory Visit (HOSPITAL_BASED_OUTPATIENT_CLINIC_OR_DEPARTMENT_OTHER): Admitting: Family Medicine

## 2023-12-20 ENCOUNTER — Encounter (HOSPITAL_BASED_OUTPATIENT_CLINIC_OR_DEPARTMENT_OTHER): Payer: Self-pay | Admitting: Family Medicine

## 2023-12-20 VITALS — BP 123/72 | HR 51 | Ht 68.0 in | Wt 141.0 lb

## 2023-12-20 DIAGNOSIS — J3489 Other specified disorders of nose and nasal sinuses: Secondary | ICD-10-CM | POA: Diagnosis not present

## 2023-12-20 DIAGNOSIS — L84 Corns and callosities: Secondary | ICD-10-CM | POA: Insufficient documentation

## 2023-12-20 NOTE — Assessment & Plan Note (Signed)
 Intermittent symptoms suggestive of allergic rhinitis. Discussed first and second-generation oral antihistamines, highlighting sedative effects of first-generation and non-sedative nature of second-generation options. - Recommend over-the-counter second-generation oral antihistamines such as Claritin, Zyrtec, or Allegra. - Consider using Flonase or Nasacort nasal steroid spray. - Consider nasal saline spray for sinus irrigation up to three times a day.

## 2023-12-20 NOTE — Progress Notes (Signed)
    Procedures performed today:    None.  Independent interpretation of notes and tests performed by another provider:   None.  Brief History, Exam, Impression, and Recommendations:    BP 123/72 (BP Location: Right Arm, Patient Position: Sitting, Cuff Size: Normal)   Pulse (!) 51   Ht 5' 8 (1.727 m)   Wt 141 lb (64 kg)   SpO2 98%   BMI 21.44 kg/m   Discussed the use of AI scribe software for clinical note transcription with the patient, who gave verbal consent to proceed.  History of Present Illness Tony Merritt is a 48 year old male who presents with a callus on the right foot.  He has had a callus on his right foot for approximately six months, located at the base of the fifth metatarsal. He reports discomfort from the callus, which is located at the base of the fifth metatarsal. He has tried using adhesive pads to reduce friction, but they do not adhere well due to foot perspiration, especially when wearing steel-toe shoes. He has switched to cheaper shoes that do not worsen the issue. There is no bleeding from the callus, and he has not experienced similar issues on the other foot or elsewhere on his body. The callus has impacted his ability to run, as he avoids running to prevent worsening the condition.  He mentions a history of a sore throat and cough following a COVID-19 exposure in April, although he never tested positive. The cough persisted until June and occasionally recurs. He suspects current symptoms may be allergy-related, including a runny nose, sneezing, and itchy, watery eyes. He has not used any over-the-counter medications for these symptoms.  He also reports a recent episode of poison ivy, which he is ten days out from, and frequently experiences poison ivy reactions. He has not used any specific treatments for this condition.  He works in an environment that requires wearing steel-toe shoes, which contributes to foot perspiration and affects the  adherence of foot pads.  Callus of foot Assessment & Plan: Callus likely due to friction and structural issues at the base of the fifth metatarsal.  Over lateral edge of right foot near base of fifth metatarsal, patient does have area of firm skin with central cord.  No significant tenderness to palpation.  No overlying skin changes such as erythema.  No fluctuance or discharge.  Area most consistent with callus, within differential includes plantar wart - Refer to podiatry for evaluation and management. - Consider using moleskin as a protective barrier.  Orders: -     Ambulatory referral to Podiatry  Sinus drainage Assessment & Plan: Intermittent symptoms suggestive of allergic rhinitis. Discussed first and second-generation oral antihistamines, highlighting sedative effects of first-generation and non-sedative nature of second-generation options. - Recommend over-the-counter second-generation oral antihistamines such as Claritin, Zyrtec, or Allegra. - Consider using Flonase or Nasacort nasal steroid spray. - Consider nasal saline spray for sinus irrigation up to three times a day.   Return if symptoms worsen or fail to improve.   ___________________________________________ Dmauri Rosenow de [place], MD, ABFM, [facility] Primary Care and Sports Medicine [facility]

## 2023-12-20 NOTE — Assessment & Plan Note (Addendum)
 Callus likely due to friction and structural issues at the base of the fifth metatarsal.  Over lateral edge of right foot near base of fifth metatarsal, patient does have area of firm skin with central cord.  No significant tenderness to palpation.  No overlying skin changes such as erythema.  No fluctuance or discharge.  Area most consistent with callus, within differential includes plantar wart - Refer to podiatry for evaluation and management. - Consider using moleskin as a protective barrier.

## 2023-12-20 NOTE — Patient Instructions (Signed)
  Medication Instructions:  Your physician recommends that you continue on your current medications as directed. Please refer to the Current Medication list given to you today. --If you need a refill on any your medications before your next appointment, please call your pharmacy first. If no refills are authorized on file call the office.--   Referrals/Procedures/Imaging: Podiatry  Follow-Up: Your next appointment:   Your physician recommends that you schedule a follow-up appointment in: as needed with Dr. de [place]  You will receive a text message or e-mail with a link to a survey about your care and experience with us  today! We would greatly appreciate your feedback!   Thanks for letting us  be apart of your health journey!!  Primary Care and Sports Medicine   Dr. Quintin sheerer Peru   We encourage you to activate your patient portal called MyChart.  Sign up information is provided on this After Visit Summary.  MyChart is used to connect with patients for Virtual Visits (Telemedicine).  Patients are able to view lab/test results, encounter notes, upcoming appointments, etc.  Non-urgent messages can be sent to your provider as well. To learn more about what you can do with MyChart, please visit --  [email or web address].

## 2023-12-24 ENCOUNTER — Ambulatory Visit (INDEPENDENT_AMBULATORY_CARE_PROVIDER_SITE_OTHER)

## 2023-12-24 DIAGNOSIS — R55 Syncope and collapse: Secondary | ICD-10-CM

## 2023-12-26 LAB — CUP PACEART REMOTE DEVICE CHECK
Date Time Interrogation Session: 20250830232056
Implantable Pulse Generator Implant Date: 20240417

## 2023-12-29 ENCOUNTER — Ambulatory Visit: Payer: Self-pay | Admitting: Cardiology

## 2024-01-01 NOTE — Progress Notes (Signed)
 Remote Loop Recorder Transmission

## 2024-01-04 ENCOUNTER — Ambulatory Visit (INDEPENDENT_AMBULATORY_CARE_PROVIDER_SITE_OTHER): Admitting: Podiatry

## 2024-01-04 DIAGNOSIS — L989 Disorder of the skin and subcutaneous tissue, unspecified: Secondary | ICD-10-CM | POA: Diagnosis not present

## 2024-01-04 DIAGNOSIS — Q666 Other congenital valgus deformities of feet: Secondary | ICD-10-CM | POA: Diagnosis not present

## 2024-01-04 NOTE — Progress Notes (Signed)
  Subjective:  Patient ID: Tony Merritt, male    DOB: [birth year],  MRN: [id number]  Chief Complaint  Patient presents with   Porokeratosis    48 y.o. male presents with the above complaint.  Patient presents for right submetatarsal 5 porokeratotic lesion painful to touch is progressing and worse worse with ambulation and shoe pressure has not seen MRIs prior to seeing me.  He would like to discuss treatment options for benign skin lesion he also has flatfoot deformity he does not wear any orthotics.  He works a lot on his foot and would like to discuss that option as well   Review of Systems: Negative except as noted in the HPI. Denies N/V/F/Ch.  No past medical history on file.  Current Outpatient Medications:    Cholecalciferol (VITAMIN D-3 PO), Take 1 capsule by mouth daily., Disp: , Rfl:   Social History   Tobacco Use  Smoking Status Never   Passive exposure: Never  Smokeless Tobacco Never    Allergies  Allergen Reactions   Shellfish Allergy Anaphylaxis and Swelling    Swelling of mouth, tongue, throat   Shrimp (Diagnostic) Anaphylaxis and Swelling    Swelling of mouth, tongue, throat   Objective:  There were no vitals filed for this visit. There is no height or weight on file to calculate BMI. Constitutional Well developed. Well nourished.  Vascular Dorsalis pedis pulses palpable bilaterally. Posterior tibial pulses palpable bilaterally. Capillary refill normal to all digits.  No cyanosis or clubbing noted. Pedal hair growth normal.  Neurologic Normal speech. Oriented to person, place, and time. Epicritic sensation to light touch grossly present bilaterally.  Dermatologic Right submetatarsal 5 porokeratotic lesion with central nucleated core noted.  Benign skin lesion noted.  No pinpoint bleeding noted upon debridement  Orthopedic: Pes planovalgus foot structure noted with calcaneovalgus to many toes as pressure greater at the arch with dorsiflexion of the  hallux unable to perform single and double heel raise   Radiographs: None Assessment:   1. Benign skin lesion   2. Pes planovalgus    Plan:  Patient was evaluated and treated and all questions answered.  Right submetatarsal 5 porokeratosis/benign skin lesion --Lesion was debrided today without complications. Hemostasis was achieved and the area was cleaned. Cantharone was applied followed by an occlusive bandage. Post procedure complications were discussed. Monitor for signs or symptoms of infection and directed to call the office mainly should any occur.  Pes planovalgus -I explained to patient the etiology of pes planovalgus and relationship with Planter fasciitis and various treatment options were discussed.  Given patient foot structure in the setting of Planter fasciitis I believe patient will benefit from custom-made orthotics to help control the hindfoot motion support the arch of the foot and take the stress away from plantar fascial.  Patient agrees with the plan like to proceed with orthotics -Patient was casted for orthotics with offloading of submetatarsal 5   No follow-ups on file.  No follow-ups on file.

## 2024-01-18 ENCOUNTER — Ambulatory Visit: Admitting: Podiatry

## 2024-01-18 DIAGNOSIS — Q666 Other congenital valgus deformities of feet: Secondary | ICD-10-CM

## 2024-01-18 DIAGNOSIS — L989 Disorder of the skin and subcutaneous tissue, unspecified: Secondary | ICD-10-CM | POA: Diagnosis not present

## 2024-01-18 NOTE — Progress Notes (Signed)
  Subjective:  Patient ID: Tony Merritt, male    DOB: Jul 26, 1975,  MRN: [id number]  Chief Complaint  Patient presents with   Benign skin lesion    Pt stated that his foot is doing much better     48 y.o. male presents with the above complaint.  Patient presents with follow-up of right submetatarsal 5 benign skin lesion he states is doing a lot better medication helped.  He is here to pick up his orthotics denies any other acute issues.  Review of Systems: Negative except as noted in the HPI. Denies N/V/F/Ch.  No past medical history on file.  Current Outpatient Medications:    Cholecalciferol (VITAMIN D-3 PO), Take 1 capsule by mouth daily., Disp: , Rfl:   Social History   Tobacco Use  Smoking Status Never   Passive exposure: Never  Smokeless Tobacco Never    Allergies  Allergen Reactions   Shellfish Allergy Anaphylaxis and Swelling    Swelling of mouth, tongue, throat   Shrimp (Diagnostic) Anaphylaxis and Swelling    Swelling of mouth, tongue, throat   Objective:  There were no vitals filed for this visit. There is no height or weight on file to calculate BMI. Constitutional Well developed. Well nourished.  Vascular Dorsalis pedis pulses palpable bilaterally. Posterior tibial pulses palpable bilaterally. Capillary refill normal to all digits.  No cyanosis or clubbing noted. Pedal hair growth normal.  Neurologic Normal speech. Oriented to person, place, and time. Epicritic sensation to light touch grossly present bilaterally.  Dermatologic Right submetatarsal 5 porokeratotic lesion without central nucleated core noted.  No further benign skin lesion noted.  No central nucleated core noted  Orthopedic: Pes planovalgus foot structure noted with calcaneovalgus to many toes as pressure greater at the arch with dorsiflexion of the hallux unable to perform single and double heel raise   Radiographs: None Assessment:   1. Benign skin lesion   2. Pes planovalgus      Plan:  Patient was evaluated and treated and all questions answered.  Right submetatarsal 5 porokeratosis/benign skin lesion -- Clinically healed and officially discharged from my care if any foot and ankle issues or in the future he will come back and see us .  Discussed prevention technique and orthotics management he is awaiting his orthotics.  Denies any other acute complaints  Pes planovalgus -I explained to patient the etiology of pes planovalgus and relationship with Planter fasciitis and various treatment options were discussed.  Given patient foot structure in the setting of Planter fasciitis I believe patient will benefit from custom-made orthotics to help control the hindfoot motion support the arch of the foot and take the stress away from plantar fascial.  Patient agrees with the plan like to proceed with orthotics -Patient was casted for orthotics with offloading of submetatarsal 5   No follow-ups on file.  No follow-ups on file.

## 2024-01-22 NOTE — Progress Notes (Signed)
 Remote Loop Recorder Transmission

## 2024-01-23 NOTE — Progress Notes (Signed)
 Remote Loop Recorder Transmission

## 2024-01-24 ENCOUNTER — Ambulatory Visit

## 2024-01-24 DIAGNOSIS — I422 Other hypertrophic cardiomyopathy: Secondary | ICD-10-CM | POA: Diagnosis not present

## 2024-01-24 LAB — CUP PACEART REMOTE DEVICE CHECK
Date Time Interrogation Session: 20251001232506
Implantable Pulse Generator Implant Date: 20240417

## 2024-01-25 ENCOUNTER — Ambulatory Visit: Payer: Self-pay | Admitting: Cardiology

## 2024-01-28 NOTE — Progress Notes (Signed)
 Remote Loop Recorder Transmission

## 2024-02-04 ENCOUNTER — Telehealth: Payer: Self-pay

## 2024-02-04 NOTE — Progress Notes (Unsigned)
  Electrophysiology Office Follow up Visit Note:    Date:  02/05/2024   ID:  Prentice LABOR Veal, DOB 28-Jan-1976, MRN [id number]  PCP:  de [place], Raymond J, MD  [facility] Cardiologist:  None  [facility] Electrophysiologist:  OLE ONEIDA HOLTS, MD    Interval History:     Tony Merritt is a 48 y.o. male who presents for a follow up visit.   The patient last saw ENT April 24, 2023.  The patient has a history of infrequent syncope and apical variant hypertrophic cardiomyopathy.  The patient has a history of vasovagal syncope as well.  Recent device interrogation has shown PVC burden 2.4%.  No atrial fibrillation.  He is doing well today.  No complaints.  He is active.  He rides his bike at least 6 miles per day.  He is no longer training for marathons.  No syncope since I last saw the patient.      Past medical, surgical, social and family history were reviewed.  ROS:   Please see the history of present illness.    All other systems reviewed and are negative.  EKGs/Labs/Other Studies Reviewed:    The following studies were reviewed today:          Physical Exam:    VS:  BP 98/62 (BP Location: Left Arm, Patient Position: Sitting, Cuff Size: Normal)   Pulse 60   Ht 5' 8 (1.727 m)   Wt 143 lb 11.2 oz (65.2 kg)   SpO2 97%   BMI 21.85 kg/m     Wt Readings from Last 3 Encounters:  02/05/24 143 lb 11.2 oz (65.2 kg)  12/20/23 141 lb (64 kg)  07/13/23 147 lb 11.2 oz (67 kg)     GEN: no distress CARD: RRR, No MRG RESP: No IWOB. CTAB.      ASSESSMENT:    1. Syncope and collapse   2. Apical variant hypertrophic cardiomyopathy (HCC)    PLAN:    In order of problems listed above:  #Syncope Likely vasovagal.  No recurrence. He remains active. We will continue monitoring with his loop recorder.   #History of apical variant hypertrophic cardiomyopathy   Follow-up 1 year with Dr. Almetta.   Signed, OLE HOLTS, MD, [facility],  [facility] 02/05/2024 1:49 PM    Electrophysiology [facility]

## 2024-02-04 NOTE — Telephone Encounter (Signed)
 Orthotics are here Charges pending insurance No financial from signed or on file Appt needed

## 2024-02-05 ENCOUNTER — Encounter: Payer: Self-pay | Admitting: Cardiology

## 2024-02-05 ENCOUNTER — Ambulatory Visit: Attending: Cardiology | Admitting: Cardiology

## 2024-02-05 VITALS — BP 98/62 | HR 60 | Ht 68.0 in | Wt 143.7 lb

## 2024-02-05 DIAGNOSIS — I422 Other hypertrophic cardiomyopathy: Secondary | ICD-10-CM

## 2024-02-05 DIAGNOSIS — R55 Syncope and collapse: Secondary | ICD-10-CM

## 2024-02-05 NOTE — Patient Instructions (Signed)
 Medication Instructions:  Your physician recommends that you continue on your current medications as directed. Please refer to the Current Medication list given to you today.  *If you need a refill on your cardiac medications before your next appointment, please call your pharmacy*  Follow-Up: At [facility], you and your health needs are our priority.  As part of our continuing mission to provide you with exceptional heart care, our providers are all part of one team.  This team includes your primary Cardiologist (physician) and Advanced Practice Providers or APPs (Physician Assistants and Nurse Practitioners) who all work together to provide you with the care you need, when you need it.  Your next appointment:   1 year(s)  Provider:   Donnice Primus, MD

## 2024-02-19 ENCOUNTER — Ambulatory Visit

## 2024-02-20 NOTE — Progress Notes (Signed)
 Patient presents today for pickup of custom orthotics that were casted at previous appointment.  They were given to him today without issue.  He did express understanding of gradual break-in period.  He tolerated them well without complication.  Return to clinic with Dr. Tobie as previously discussed

## 2024-02-25 ENCOUNTER — Ambulatory Visit

## 2024-02-25 DIAGNOSIS — R55 Syncope and collapse: Secondary | ICD-10-CM | POA: Diagnosis not present

## 2024-02-25 LAB — CUP PACEART REMOTE DEVICE CHECK
Date Time Interrogation Session: 20251102233314
Implantable Pulse Generator Implant Date: 20240417

## 2024-02-26 ENCOUNTER — Ambulatory Visit: Payer: Self-pay | Admitting: Cardiology

## 2024-02-28 NOTE — Progress Notes (Signed)
 Remote Loop Recorder Transmission

## 2024-03-27 ENCOUNTER — Encounter

## 2024-03-29 ENCOUNTER — Ambulatory Visit: Attending: Cardiology

## 2024-03-31 LAB — CUP PACEART REMOTE DEVICE CHECK
Date Time Interrogation Session: 20251205232244
Implantable Pulse Generator Implant Date: 20240417

## 2024-04-03 NOTE — Progress Notes (Signed)
 Remote Loop Recorder Transmission

## 2024-04-10 ENCOUNTER — Ambulatory Visit: Payer: Self-pay | Admitting: Cardiology

## 2024-04-29 ENCOUNTER — Ambulatory Visit

## 2024-04-29 DIAGNOSIS — R55 Syncope and collapse: Secondary | ICD-10-CM | POA: Diagnosis not present

## 2024-04-29 LAB — CUP PACEART REMOTE DEVICE CHECK
Date Time Interrogation Session: 20260105232709
Implantable Pulse Generator Implant Date: 20240417

## 2024-05-01 NOTE — Progress Notes (Signed)
 Remote Loop Recorder Transmission

## 2024-05-03 ENCOUNTER — Ambulatory Visit: Payer: Self-pay | Admitting: Cardiology

## 2024-05-30 ENCOUNTER — Ambulatory Visit

## 2024-05-30 LAB — CUP PACEART REMOTE DEVICE CHECK
Date Time Interrogation Session: 20260205232452
Implantable Pulse Generator Implant Date: 20240417

## 2024-06-30 ENCOUNTER — Ambulatory Visit

## 2024-07-14 ENCOUNTER — Encounter (HOSPITAL_BASED_OUTPATIENT_CLINIC_OR_DEPARTMENT_OTHER): Admitting: Family Medicine
